# Patient Record
Sex: Male | Born: 1990 | Marital: Single | Smoking: Never smoker
Health system: Southern US, Community
[De-identification: ages and names within clinical notes are randomized; demographics above are authoritative.]

## PROBLEM LIST (undated history)

## (undated) DIAGNOSIS — I10 Essential (primary) hypertension: Secondary | ICD-10-CM

---

## 2010-11-05 ENCOUNTER — Other Ambulatory Visit: Payer: Self-pay | Admitting: Dermatology

## 2019-10-29 ENCOUNTER — Other Ambulatory Visit: Payer: Self-pay | Admitting: Nurse Practitioner

## 2019-10-29 DIAGNOSIS — M545 Low back pain, unspecified: Secondary | ICD-10-CM

## 2019-10-29 DIAGNOSIS — M5416 Radiculopathy, lumbar region: Secondary | ICD-10-CM

## 2019-11-13 ENCOUNTER — Other Ambulatory Visit: Payer: Self-pay

## 2019-11-13 ENCOUNTER — Ambulatory Visit
Admission: RE | Admit: 2019-11-13 | Discharge: 2019-11-13 | Disposition: A | Discharge status: Home / Self Care | Payer: Commercial Managed Care - PPO | Source: Ambulatory Visit | Attending: Nurse Practitioner | Admitting: Nurse Practitioner

## 2019-11-13 DIAGNOSIS — M545 Low back pain, unspecified: Secondary | ICD-10-CM

## 2019-11-13 DIAGNOSIS — M5416 Radiculopathy, lumbar region: Secondary | ICD-10-CM | POA: Diagnosis present

## 2020-03-19 ENCOUNTER — Emergency Department
Admission: EM | Admit: 2020-03-19 | Discharge: 2020-03-20 | Disposition: A | Discharge status: Home / Self Care | Payer: Commercial Managed Care - PPO | Attending: Emergency Medicine | Admitting: Emergency Medicine

## 2020-03-19 ENCOUNTER — Emergency Department: Payer: Commercial Managed Care - PPO

## 2020-03-19 ENCOUNTER — Other Ambulatory Visit: Payer: Self-pay

## 2020-03-19 ENCOUNTER — Encounter: Payer: Self-pay | Admitting: *Deleted

## 2020-03-19 DIAGNOSIS — R29818 Other symptoms and signs involving the nervous system: Secondary | ICD-10-CM

## 2020-03-19 DIAGNOSIS — G459 Transient cerebral ischemic attack, unspecified: Secondary | ICD-10-CM | POA: Insufficient documentation

## 2020-03-19 DIAGNOSIS — R4701 Aphasia: Secondary | ICD-10-CM | POA: Diagnosis present

## 2020-03-19 LAB — CBC
HCT: 38 % — ABNORMAL LOW (ref 39.0–52.0)
Hemoglobin: 13 g/dL (ref 13.0–17.0)
MCH: 30.3 pg (ref 26.0–34.0)
MCHC: 34.2 g/dL (ref 30.0–36.0)
MCV: 88.6 fL (ref 80.0–100.0)
Platelets: 433 10*3/uL — ABNORMAL HIGH (ref 150–400)
RBC: 4.29 MIL/uL (ref 4.22–5.81)
RDW: 11.5 % (ref 11.5–15.5)
WBC: 5.9 10*3/uL (ref 4.0–10.5)
nRBC: 0 % (ref 0.0–0.2)

## 2020-03-19 LAB — COMPREHENSIVE METABOLIC PANEL
ALT: 56 U/L — ABNORMAL HIGH (ref 0–44)
AST: 48 U/L — ABNORMAL HIGH (ref 15–41)
Albumin: 3.9 g/dL (ref 3.5–5.0)
Alkaline Phosphatase: 43 U/L (ref 38–126)
Anion gap: 10 (ref 5–15)
BUN: 11 mg/dL (ref 6–20)
CO2: 24 mmol/L (ref 22–32)
Calcium: 9.2 mg/dL (ref 8.9–10.3)
Chloride: 105 mmol/L (ref 98–111)
Creatinine, Ser: 0.89 mg/dL (ref 0.61–1.24)
GFR, Estimated: >60 mL/min (ref >60)
Glucose, Bld: 138 mg/dL — ABNORMAL HIGH (ref 70–99)
Potassium: 3.4 mmol/L — ABNORMAL LOW (ref 3.5–5.1)
Sodium: 139 mmol/L (ref 135–145)
Total Bilirubin: 0.7 mg/dL (ref 0.3–1.2)
Total Protein: 7.5 g/dL (ref 6.5–8.1)

## 2020-03-19 LAB — DIFFERENTIAL
Abs Immature Granulocytes: 0.05 10*3/uL (ref 0.00–0.07)
Basophils Absolute: 0 10*3/uL (ref 0.0–0.1)
Basophils Relative: 1 %
Eosinophils Absolute: 0 10*3/uL (ref 0.0–0.5)
Eosinophils Relative: 1 %
Immature Granulocytes: 1 %
Lymphocytes Relative: 45 %
Lymphs Abs: 2.7 10*3/uL (ref 0.7–4.0)
Monocytes Absolute: 0.6 10*3/uL (ref 0.1–1.0)
Monocytes Relative: 11 %
Neutro Abs: 2.4 10*3/uL (ref 1.7–7.7)
Neutrophils Relative %: 41 %

## 2020-03-19 LAB — PROTIME-INR
INR: 1 (ref 0.8–1.2)
Prothrombin Time: 12.6 seconds (ref 11.4–15.2)

## 2020-03-19 LAB — CBG MONITORING, ED: Glucose-Capillary: 130 mg/dL — ABNORMAL HIGH (ref 70–99)

## 2020-03-19 LAB — APTT: aPTT: 26 seconds (ref 24–36)

## 2020-03-19 MED ORDER — SODIUM CHLORIDE 0.9% FLUSH: 3.0000 mL | Freq: Once | INTRAVENOUS | Status: DC

## 2020-03-19 NOTE — ED Triage Notes (Addendum)
Pt to ED via EMS reporting changes in his speech for the past 2 days with intermittent headaches and cold like symptoms. Pt reporting "slurred speech" to EMS but upon assessment pt is not slurring words but stuttering. No other deficits noted. No trauma recently. No blood thinner use.   Pt tested negative for COVID this week.

## 2020-03-19 NOTE — ED Notes (Signed)
Pt transported to CT via Wheelchair

## 2020-03-19 NOTE — ED Provider Notes (Incomplete)
[facility] Emergency Department Provider Note  ____________________________________________   Event Date/Time   First MD Initiated Contact with Patient 03/19/20 2343     (approximate)  I have reviewed the triage vital signs and the nursing notes.   HISTORY  Chief Complaint Aphasia    HPI Jeffrey Zamora is a 30 y.o. male with no chronic medical issues who presents with concern for aphasia.  He said that he has felt poorly for the last few days with COVID-like symptoms including fatigue, malaise,  congestion, and intermittent headaches.  [facility] and they tested him for COVID-19 which was negative, and they recommended that he take Mucinex, ibuprofen, and Tylenol.  Earlier tonight he was by himself at home and did not feel well and when he talked he felt like he was slurring his speech.  He looked it up on the Internet and the Internet said that he may be having a stroke so he called 911.  When he was seen by EMS and in triage, it seems that he was occasionally stuttering rather than slurring words or having difficulty finding words or articulating them.  He said the symptoms were acute in onset and he feels that they were severe, nothing in particular made them better or worse but he feels much better now it feels like the symptoms have resolved.  He denies having any weakness or numbness in his extremities.  No balance issues.  No vision changes.  He denies drug or alcohol use.  No medications other than the 3 described above.  He denies fever, neck pain, neck stiffness, chest pain, shortness of breath, cough, nausea, vomiting, and abdominal pain.  He has never had symptoms like this in the past.  No recent history of trauma.  No history of blood clots.        History reviewed. No pertinent past medical history.  There are no problems to display for this patient.   History reviewed. No pertinent surgical history.  Prior to Admission  medications   Not on File    Allergies Patient has no known allergies.  History reviewed. No pertinent family history.  Social History Social History   Tobacco Use  . Smoking status: Never Smoker  . Smokeless tobacco: Never Used  Substance Use Topics  . Alcohol use: Not Currently  . Drug use: Never    Review of Systems Constitutional: No fever/chills Eyes: No visual changes. ENT: No sore throat. Cardiovascular: Denies chest pain. Respiratory: Denies shortness of breath. Gastrointestinal: No abdominal pain.  No nausea, no vomiting.  No diarrhea.  No constipation. Genitourinary: Negative for dysuria. Musculoskeletal: Negative for neck pain.  Negative for back pain. Integumentary: Negative for rash. Neurological: Transient episode of "slurred speech" versus stuttering.  Negative for headaches, focal weakness or numbness.   ____________________________________________   PHYSICAL EXAM:  VITAL SIGNS: ED Triage Vitals [03/19/20 1936]  Enc Vitals Group     BP (!) 159/96     Pulse Rate (!) 107     Resp 16     Temp 97.7 F (36.5 C)     Temp Source Oral     SpO2 98 %     Weight 113.4 kg (250 lb)     Height 1.829 m (6')     Head Circumference      Peak Flow      Pain Score 0     Pain Loc      Pain Edu?  Excl. in GC?     Constitutional: Alert and oriented.  Eyes: Conjunctivae are normal.  Pupils are equal and reactive.  Extraocular motion is intact, no nystagmus. Head: Atraumatic. Nose: No congestion/rhinnorhea. Mouth/Throat: Patient is wearing a mask. Neck: No stridor.  No meningeal signs.   Cardiovascular: Normal rate, regular rhythm. Good peripheral circulation. Respiratory: Normal respiratory effort.  No retractions. Gastrointestinal: Soft and nontender. No distention.  Musculoskeletal: No lower extremity tenderness nor edema. No gross deformities of extremities. Neurologic:  Normal speech and language. No gross focal neurologic deficits are appreciated;  he has no facial palsy or other cranial nerve deficits, good muscle strength throughout upper and lower extremities, no dysmetria on finger-nose testing, no abnormal gait.  His speech is clear with no stuttering, dysarthria, nor aphasia. Skin:  Skin is warm, dry and intact. Psychiatric: Mood and affect are a little bit anxious but generally appropriate under the circumstances.  ____________________________________________   LABS (all labs ordered are listed, but only abnormal results are displayed)  Labs Reviewed  CBC - Abnormal; Notable for the following components:      Result Value   HCT 38.0 (*)    Platelets 433 (*)    All other components within normal limits  COMPREHENSIVE METABOLIC PANEL - Abnormal; Notable for the following components:   Potassium 3.4 (*)    Glucose, Bld 138 (*)    AST 48 (*)    ALT 56 (*)    All other components within normal limits  CBG MONITORING, ED - Abnormal; Notable for the following components:   Glucose-Capillary 130 (*)    All other components within normal limits  PROTIME-INR  APTT  DIFFERENTIAL   ____________________________________________  EKG  ED ECG REPORT I, Jeffrey Zamora, the attending physician, personally viewed and interpreted this ECG.  Date: 03/19/2020 EKG Time: 20: 27 Rate: 111 Rhythm: Sinus tachycardia QRS Axis: normal Intervals: normal ST/T Wave abnormalities: normal Narrative Interpretation: no evidence of acute ischemia  ____________________________________________  RADIOLOGY I, Jeffrey Zamora, personally viewed and evaluated these images (plain radiographs) as part of my medical decision making, as well as reviewing the written report by the radiologist.  ED MD interpretation: No acute abnormalities identified on noncontrast head CT.  Official radiology report(s): CT HEAD WO CONTRAST  Result Date: 03/19/2020 CLINICAL DATA:  Neuro deficit, acute, stroke suspected. Changes in speech for 2 days. Intermittent  headache. EXAM: CT HEAD WITHOUT CONTRAST TECHNIQUE: Contiguous axial images were obtained from the base of the skull through the vertex without intravenous contrast. COMPARISON:  None. FINDINGS: Brain: Brain volume is normal for age. No intracranial hemorrhage, mass effect, or midline shift. No hydrocephalus. The basilar cisterns are patent. No evidence of territorial infarct or acute ischemia. No extra-axial or intracranial fluid collection. Vascular: No hyperdense vessel. Skull: No fracture or focal lesion. Sinuses/Orbits: Minimal mucosal thickening of left side of sphenoid sinus. Remaining paranasal sinuses are clear. Included orbits are unremarkable. Mastoid air cells are well aerated. Other: None. IMPRESSION: Negative noncontrast head CT. Electronically Signed   By: Narda Rutherford M.D.   On: 03/19/2020 20:01    ____________________________________________   PROCEDURES   Procedure(s) performed (including Critical Care):  Procedures   ____________________________________________   INITIAL IMPRESSION / MDM / ASSESSMENT AND PLAN / ED COURSE  As part of my medical decision making, I reviewed the following data within the electronic MEDICAL RECORD NUMBER History obtained from family, Nursing notes reviewed and incorporated, Labs reviewed , EKG interpreted , Old chart reviewed and Notes from  prior ED visits   Differential diagnosis includes, but is not limited to, anxiety, medication side effect or drug use, CVA/TIA, metabolic or electrolyte abnormality, infectious process such as COVID or even meningitis/encephalitis.  Patient is very well-appearing, calm and cooperative, and currently asymptomatic.  Vital signs are normal other than some mild hypertension.  I personally reviewed the patient's head CT and agree with the radiologist assessment that there is no evidence of any acute abnormality.  CBC with differential, coagulation studies, and comprehensive metabolic panel are all essentially  normal other than a very mild AST and ALT elevation which is of unclear clinical significance but are just barely above the upper limit of normal.  Patient is neurologically intact at this time           ____________________________________________  FINAL CLINICAL IMPRESSION(S) / ED DIAGNOSES  Final diagnoses:  None     MEDICATIONS GIVEN DURING THIS VISIT:  Medications - No data to display   ED Discharge Orders    None      *Please note:  Jeffrey Zamora was evaluated in Emergency Department on 03/19/2020 for the symptoms described in the history of present illness. He was evaluated in the context of the global COVID-19 pandemic, which necessitated consideration that the patient might be at risk for infection with the SARS-CoV-2 virus that causes COVID-19. Institutional protocols and algorithms that pertain to the evaluation of patients at risk for COVID-19 are in a state of rapid change based on information released by regulatory bodies including the CDC and federal and state organizations. These policies and algorithms were followed during the patient's care in the ED.  Some ED evaluations and interventions may be delayed as a result of limited staffing during and after the pandemic.*  Note:  This document was prepared using Dragon voice recognition software and may include unintentional dictation errors.

## 2020-03-19 NOTE — ED Provider Notes (Signed)
[facility] Emergency Department Provider Note  ____________________________________________   Event Date/Time   First MD Initiated Contact with Patient 03/19/20 2343     (approximate)  I have reviewed the triage vital signs and the nursing notes.   HISTORY  Chief Complaint Aphasia    HPI Jeffrey Zamora is a 30 y.o. male with no chronic medical issues who presents with concern for aphasia.  He said that he has felt poorly for the last few days with COVID-like symptoms including fatigue, malaise,  congestion, and intermittent headaches.  [facility] and they tested him for COVID-19 which was negative, and they recommended that he take Mucinex, ibuprofen, and Tylenol.  Earlier tonight he was by himself at home and did not feel well and when he talked he felt like he was slurring his speech.  He looked it up on the Internet and the Internet said that he may be having a stroke so he called 911.  When he was seen by EMS and in triage, it seems that he was occasionally stuttering rather than slurring words or having difficulty finding words or articulating them.  He said the symptoms were acute in onset and he feels that they were severe, nothing in particular made them better or worse but he feels much better now it feels like the symptoms have resolved.  He denies having any weakness or numbness in his extremities.  No balance issues.  No vision changes.  He denies drug or alcohol use.  No medications other than the 3 described above.  He denies fever, neck pain, neck stiffness, chest pain, shortness of breath, cough, nausea, vomiting, and abdominal pain.  He has never had symptoms like this in the past.  No recent history of trauma.  No history of blood clots.        History reviewed. No pertinent past medical history.  There are no problems to display for this patient.   History reviewed. No pertinent surgical history.  Prior to Admission  medications   Not on File    Allergies Patient has no known allergies.  History reviewed. No pertinent family history.  Social History Social History   Tobacco Use  . Smoking status: Never Smoker  . Smokeless tobacco: Never Used  Substance Use Topics  . Alcohol use: Not Currently  . Drug use: Never    Review of Systems Constitutional: No fever/chills Eyes: No visual changes. ENT: No sore throat. Cardiovascular: Denies chest pain. Respiratory: Denies shortness of breath. Gastrointestinal: No abdominal pain.  No nausea, no vomiting.  No diarrhea.  No constipation. Genitourinary: Negative for dysuria. Musculoskeletal: Negative for neck pain.  Negative for back pain. Integumentary: Negative for rash. Neurological: Transient episode of "slurred speech" versus stuttering.  Negative for headaches, focal weakness or numbness.   ____________________________________________   PHYSICAL EXAM:  VITAL SIGNS: ED Triage Vitals [03/19/20 1936]  Enc Vitals Group     BP (!) 159/96     Pulse Rate (!) 107     Resp 16     Temp 97.7 F (36.5 C)     Temp Source Oral     SpO2 98 %     Weight 113.4 kg (250 lb)     Height 1.829 m (6')     Head Circumference      Peak Flow      Pain Score 0     Pain Loc      Pain Edu?  Excl. in GC?     Constitutional: Alert and oriented.  Eyes: Conjunctivae are normal.  Pupils are equal and reactive.  Extraocular motion is intact, no nystagmus. Head: Atraumatic. Nose: No congestion/rhinnorhea. Mouth/Throat: Patient is wearing a mask. Neck: No stridor.  No meningeal signs.   Cardiovascular: Normal rate, regular rhythm. Good peripheral circulation. Respiratory: Normal respiratory effort.  No retractions. Gastrointestinal: Soft and nontender. No distention.  Musculoskeletal: No lower extremity tenderness nor edema. No gross deformities of extremities. Neurologic:  Normal speech and language. No gross focal neurologic deficits are appreciated;  he has no facial palsy or other cranial nerve deficits, good muscle strength throughout upper and lower extremities, no dysmetria on finger-nose testing, no abnormal gait.  His speech is clear with no stuttering, dysarthria, nor aphasia. Skin:  Skin is warm, dry and intact. Psychiatric: Mood and affect are a little bit anxious but generally appropriate under the circumstances.  ____________________________________________   LABS (all labs ordered are listed, but only abnormal results are displayed)  Labs Reviewed  CBC - Abnormal; Notable for the following components:      Result Value   HCT 38.0 (*)    Platelets 433 (*)    All other components within normal limits  COMPREHENSIVE METABOLIC PANEL - Abnormal; Notable for the following components:   Potassium 3.4 (*)    Glucose, Bld 138 (*)    AST 48 (*)    ALT 56 (*)    All other components within normal limits  CBG MONITORING, ED - Abnormal; Notable for the following components:   Glucose-Capillary 130 (*)    All other components within normal limits  PROTIME-INR  APTT  DIFFERENTIAL   ____________________________________________  EKG  ED ECG REPORT I, Loleta Rose, the attending physician, personally viewed and interpreted this ECG.  Date: 03/19/2020 EKG Time: 20: 27 Rate: 111 Rhythm: Sinus tachycardia QRS Axis: normal Intervals: normal ST/T Wave abnormalities: normal Narrative Interpretation: no evidence of acute ischemia  ____________________________________________  RADIOLOGY I, Loleta Rose, personally viewed and evaluated these images (plain radiographs) as part of my medical decision making, as well as reviewing the written report by the radiologist.  ED MD interpretation: No acute abnormalities identified on noncontrast head CT.  Official radiology report(s): CT HEAD WO CONTRAST  Result Date: 03/19/2020 CLINICAL DATA:  Neuro deficit, acute, stroke suspected. Changes in speech for 2 days. Intermittent  headache. EXAM: CT HEAD WITHOUT CONTRAST TECHNIQUE: Contiguous axial images were obtained from the base of the skull through the vertex without intravenous contrast. COMPARISON:  None. FINDINGS: Brain: Brain volume is normal for age. No intracranial hemorrhage, mass effect, or midline shift. No hydrocephalus. The basilar cisterns are patent. No evidence of territorial infarct or acute ischemia. No extra-axial or intracranial fluid collection. Vascular: No hyperdense vessel. Skull: No fracture or focal lesion. Sinuses/Orbits: Minimal mucosal thickening of left side of sphenoid sinus. Remaining paranasal sinuses are clear. Included orbits are unremarkable. Mastoid air cells are well aerated. Other: None. IMPRESSION: Negative noncontrast head CT. Electronically Signed   By: Narda Rutherford M.D.   On: 03/19/2020 20:01    ____________________________________________   PROCEDURES   Procedure(s) performed (including Critical Care):  Procedures   ____________________________________________   INITIAL IMPRESSION / MDM / ASSESSMENT AND PLAN / ED COURSE  As part of my medical decision making, I reviewed the following data within the electronic MEDICAL RECORD NUMBER History obtained from family, Nursing notes reviewed and incorporated, Labs reviewed , EKG interpreted , Old chart reviewed and Notes from  prior ED visits   Differential diagnosis includes, but is not limited to, anxiety, medication side effect or drug use, CVA/TIA, metabolic or electrolyte abnormality, infectious process such as COVID or even meningitis/encephalitis.  Patient is very well-appearing, calm and cooperative, and currently asymptomatic.  Vital signs are normal other than some mild hypertension.  I personally reviewed the patient's head CT and agree with the radiologist assessment that there is no evidence of any acute abnormality.  CBC with differential, coagulation studies, and comprehensive metabolic panel are all essentially  normal other than a very mild AST and ALT elevation which is of unclear clinical significance but are just barely above the upper limit of normal.  EKG was notable for tachycardia in triage which is likely due to anxiety but that tachycardia has resolved and there is no evidence of any other abnormality or ischemia nor arrhythmia.  Patient is neurologically intact at this time.  I provided reassurance and explained I think it is extremely unlikely that he is having a stroke.  I explained the reassuring results that we have thus far.  I explained that we could get an MR brain to further rule out CVA but that I do not feel it is necessary given his very low risk and reassuring findings thus far.  I explained that I think it is possible that he is not feeling well and generalized and started to have a little bit of anxiety or panic when his body feels different than normal and he started looking on the Internet and became afraid he was having a stroke.  His mother is at bedside and was nodding and seems to agree with this assessment.  The patient feels reassured as well and he declines the MRI at this time.  I am giving him follow-up information to help establish a primary care doctor and I gave my usual and customary return precautions.           ____________________________________________  FINAL CLINICAL IMPRESSION(S) / ED DIAGNOSES  Final diagnoses:  Transient neurologic deficit     MEDICATIONS GIVEN DURING THIS VISIT:  Medications - No data to display   ED Discharge Orders    None      *Please note:  ALEXANDERJAMES BERG was evaluated in Emergency Department on 03/20/2020 for the symptoms described in the history of present illness. He was evaluated in the context of the global COVID-19 pandemic, which necessitated consideration that the patient might be at risk for infection with the SARS-CoV-2 virus that causes COVID-19. Institutional protocols and algorithms that pertain to the evaluation  of patients at risk for COVID-19 are in a state of rapid change based on information released by regulatory bodies including the CDC and federal and state organizations. These policies and algorithms were followed during the patient's care in the ED.  Some ED evaluations and interventions may be delayed as a result of limited staffing during and after the pandemic.*  Note:  This document was prepared using Dragon voice recognition software and may include unintentional dictation errors.   Loleta Rose, MD 03/20/20 [phone]

## 2020-03-20 NOTE — Discharge Instructions (Addendum)
Your workup in the Emergency Department today was reassuring.  We did not find any specific abnormalities.  We recommend you drink plenty of fluids, take your regular medications and/or any new ones prescribed today, and follow up with the doctor(s) listed in these documents as recommended.  Return to the Emergency Department if you develop new or worsening symptoms that concern you.  

## 2020-05-19 ENCOUNTER — Encounter: Payer: Self-pay | Admitting: Adult Health

## 2020-05-19 ENCOUNTER — Ambulatory Visit (INDEPENDENT_AMBULATORY_CARE_PROVIDER_SITE_OTHER): Payer: Commercial Managed Care - PPO | Admitting: Adult Health

## 2020-05-19 ENCOUNTER — Other Ambulatory Visit: Payer: Self-pay

## 2020-05-19 VITALS — BP 132/83 | HR 93 | Temp 97.9°F | Resp 16 | Ht 72.0 in | Wt 259.8 lb

## 2020-05-19 DIAGNOSIS — Z6836 Body mass index (BMI) 36.0-36.9, adult: Secondary | ICD-10-CM | POA: Insufficient documentation

## 2020-05-19 DIAGNOSIS — Z113 Encounter for screening for infections with a predominantly sexual mode of transmission: Secondary | ICD-10-CM

## 2020-05-19 DIAGNOSIS — Z0000 Encounter for general adult medical examination without abnormal findings: Secondary | ICD-10-CM | POA: Insufficient documentation

## 2020-05-19 DIAGNOSIS — Z1389 Encounter for screening for other disorder: Secondary | ICD-10-CM

## 2020-05-19 DIAGNOSIS — E559 Vitamin D deficiency, unspecified: Secondary | ICD-10-CM

## 2020-05-19 DIAGNOSIS — Z1159 Encounter for screening for other viral diseases: Secondary | ICD-10-CM | POA: Diagnosis not present

## 2020-05-19 DIAGNOSIS — Z6835 Body mass index (BMI) 35.0-35.9, adult: Secondary | ICD-10-CM

## 2020-05-19 DIAGNOSIS — Z23 Encounter for immunization: Secondary | ICD-10-CM | POA: Insufficient documentation

## 2020-05-19 DIAGNOSIS — Z87898 Personal history of other specified conditions: Secondary | ICD-10-CM

## 2020-05-19 DIAGNOSIS — K644 Residual hemorrhoidal skin tags: Secondary | ICD-10-CM | POA: Diagnosis not present

## 2020-05-19 DIAGNOSIS — Z114 Encounter for screening for human immunodeficiency virus [HIV]: Secondary | ICD-10-CM | POA: Diagnosis not present

## 2020-05-19 LAB — POCT URINALYSIS DIPSTICK
Bilirubin, UA: NEGATIVE
Blood, UA: NEGATIVE
Glucose, UA: NEGATIVE
Ketones, UA: NEGATIVE
Leukocytes, UA: NEGATIVE
Nitrite, UA: NEGATIVE
Protein, UA: NEGATIVE
Spec Grav, UA: 1.01 (ref 1.010–1.025)
Urobilinogen, UA: 0.2 E.U./dL
pH, UA: 5 (ref 5.0–8.0)

## 2020-05-19 MED ORDER — HYDROCORTISONE (PERIANAL) 2.5 % EX CREA: 1.0000 "application " | TOPICAL_CREAM | Freq: Two times a day (BID) | CUTANEOUS | 0 refills | Status: DC

## 2020-05-19 NOTE — Progress Notes (Signed)
New patient visit   Patient: Jeffrey Zamora   DOB: 09-Mar-1990   30 y.o. Male  MRN: [id number] Visit Date: 05/19/2020  Today's healthcare provider: Marcille Buffy, FNP   Chief Complaint  Patient presents with  . New Patient (Initial Visit)   Subjective    Jeffrey Zamora is a 30 y.o. male who presents today as a new patient to establish care.  HPI  Patient presents in office today to establish care he states that he feels well today and has no concerns to address. Patient reports that he follows a general diet, he is staying active by walking daily and states that his sleep habits are fair.   He was tested covid symptoms and tested negative, stuttering speech at that time and called 911. CT of head was negative. He was having some diarrhea at that time.   He feels he has a hemorrhoid. present for few months. Denies any bleeding or pain. Denies any black or tarry stools.  Feels well now and has had no other symptoms or episodes.   Patient  denies any fever, body aches,chills, rash, chest pain, shortness of breath, nausea, vomiting, or diarrhea.  Denies dizziness, lightheadedness, pre syncopal or syncopal episodes.   History reviewed. No pertinent past medical history. History reviewed. No pertinent surgical history. No family status information on file.   History reviewed. No pertinent family history. Social History   Socioeconomic History  . Marital status: Single    Spouse name: Not on file  . Number of children: Not on file  . Years of education: Not on file  . Highest education level: Not on file  Occupational History  . Not on file  Tobacco Use  . Smoking status: Never Smoker  . Smokeless tobacco: Never Used  Substance and Sexual Activity  . Alcohol use: Not Currently  . Drug use: Never  . Sexual activity: Not Currently  Other Topics Concern  . Not on file  Social History Narrative  . Not on file   Social Determinants of Health   Financial  Resource Strain: Not on file  Food Insecurity: Not on file  Transportation Needs: Not on file  Physical Activity: Not on file  Stress: Not on file  Social Connections: Not on file   No outpatient medications prior to visit.   No facility-administered medications prior to visit.   No Known Allergies  There is no immunization history for the selected administration types on file for this patient.  Health Maintenance  Topic Date Due  . TETANUS/TDAP  Never done  . COVID-19 Vaccine (1) 06/04/2020 (Originally 12/18/1995)  . INFLUENZA VACCINE  06/05/2020 (Originally 10/07/2019)  . Hepatitis C Screening  Completed  . HIV Screening  Completed  . HPV VACCINES  Aged Out    Patient Care Team: Moiz Ryant, Kelby Aline, FNP as PCP - General (Family Medicine)  Review of Systems  Constitutional: Positive for fatigue.  HENT: Positive for rhinorrhea.   Musculoskeletal: Positive for back pain.  All other systems reviewed and are negative.      Objective    BP 132/83   Pulse 93   Temp 97.9 F (36.6 C) (Oral)   Resp 16   Ht 6' (1.829 m)   Wt 259 lb 12.8 oz (117.8 kg)   SpO2 100%   BMI 35.24 kg/m  Physical Exam Vitals and nursing note reviewed. Exam conducted with a chaperone present.  Constitutional:      General: He is not in  acute distress.    Appearance: Normal appearance. He is well-developed. He is obese. He is not ill-appearing, toxic-appearing or diaphoretic.     Comments: Patient is alert and oriented and responsive to questions Engages in eye contact with provider. Speaks in full sentences without any pauses without any shortness of breath or distress.    HENT:     Head: Normocephalic and atraumatic.     Right Ear: Hearing, tympanic membrane, ear canal and external ear normal.     Left Ear: Hearing, tympanic membrane, ear canal and external ear normal.     Nose: Nose normal.     Mouth/Throat:     Pharynx: Uvula midline. No oropharyngeal exudate.  Eyes:     General: Lids  are normal. No scleral icterus.       Right eye: No discharge.        Left eye: No discharge.     Conjunctiva/sclera: Conjunctivae normal.     Pupils: Pupils are equal, round, and reactive to light.  Neck:     Thyroid: No thyromegaly.     Vascular: Normal carotid pulses. No carotid bruit, hepatojugular reflux or JVD.     Trachea: Trachea and phonation normal. No tracheal tenderness or tracheal deviation.     Meningeal: Brudzinski's sign absent.  Cardiovascular:     Rate and Rhythm: Normal rate and regular rhythm.     Pulses: Normal pulses.     Heart sounds: Normal heart sounds, S1 normal and S2 normal. Heart sounds not distant. No murmur heard. No friction rub. No gallop.   Pulmonary:     Effort: Pulmonary effort is normal. No accessory muscle usage or respiratory distress.     Breath sounds: Normal breath sounds. No stridor. No wheezing or rales.  Chest:     Chest wall: No tenderness.  Abdominal:     General: Bowel sounds are normal. There is no distension.     Palpations: Abdomen is soft. There is no mass.     Tenderness: There is no abdominal tenderness. There is no guarding or rebound.     Hernia: No hernia is present.  Genitourinary:    Rectum: Guaiac result negative. External hemorrhoid and internal hemorrhoid present. No mass, tenderness or anal fissure. Normal anal tone.       Comments: Internal small hemorrhoid internal at 3 o 'clock. Musculoskeletal:        General: No tenderness or deformity. Normal range of motion.     Cervical back: Full passive range of motion without pain, normal range of motion and neck supple.     Comments: Patient moves on and off of exam table and in room without difficulty. Gait is normal in hall and in room. Patient is oriented to person place time and situation. Patient answers questions appropriately and engages in conversation.   Lymphadenopathy:     Head:     Right side of head: No submental, submandibular, tonsillar, preauricular,  posterior auricular or occipital adenopathy.     Left side of head: No submental, submandibular, tonsillar, preauricular, posterior auricular or occipital adenopathy.     Cervical: No cervical adenopathy.  Skin:    General: Skin is warm and dry.     Capillary Refill: Capillary refill takes less than 2 seconds.     Coloration: Skin is not pale.     Findings: No erythema or rash.     Nails: There is no clubbing.  Neurological:     Mental Status: He is alert and oriented   to person, place, and time.     GCS: GCS eye subscore is 4. GCS verbal subscore is 5. GCS motor subscore is 6.     Cranial Nerves: No cranial nerve deficit.     Sensory: No sensory deficit.     Motor: No abnormal muscle tone.     Coordination: Coordination normal.     Gait: Gait normal.     Deep Tendon Reflexes: Reflexes are normal and symmetric. Reflexes normal.  Psychiatric:        Speech: Speech normal.        Behavior: Behavior normal.        Thought Content: Thought content normal.        Judgment: Judgment normal.      Depression Screen PHQ 2/9 Scores 05/19/2020  PHQ - 2 Score 3  PHQ- 9 Score 16   Results for orders placed or performed in visit on 05/19/20  CBC with Differential/Platelet  Result Value Ref Range   WBC 6.5 3.4 - 10.8 x10E3/uL   RBC 4.88 4.14 - 5.80 x10E6/uL   Hemoglobin 15.3 13.0 - 17.7 g/dL   Hematocrit 43.7 37.5 - 51.0 %   MCV 90 79 - 97 fL   MCH 31.4 26.6 - 33.0 pg   MCHC 35.0 31.5 - 35.7 g/dL   RDW 12.4 11.6 - 15.4 %   Platelets 238 150 - 450 x10E3/uL   Neutrophils 57 Not Estab. %   Lymphs 33 Not Estab. %   Monocytes 7 Not Estab. %   Eos 2 Not Estab. %   Basos 1 Not Estab. %   Neutrophils Absolute 3.8 1.4 - 7.0 x10E3/uL   Lymphocytes Absolute 2.2 0.7 - 3.1 x10E3/uL   Monocytes Absolute 0.4 0.1 - 0.9 x10E3/uL   EOS (ABSOLUTE) 0.1 0.0 - 0.4 x10E3/uL   Basophils Absolute 0.0 0.0 - 0.2 x10E3/uL   Immature Granulocytes 0 Not Estab. %   Immature Grans (Abs) 0.0 0.0 - 0.1 x10E3/uL   Comprehensive metabolic panel  Result Value Ref Range   Glucose 87 65 - 99 mg/dL   BUN 11 6 - 20 mg/dL   Creatinine, Ser 0.91 0.76 - 1.27 mg/dL   eGFR 117 >59 mL/min/1.73   BUN/Creatinine Ratio 12 9 - 20   Sodium 137 134 - 144 mmol/L   Potassium 4.6 3.5 - 5.2 mmol/L   Chloride 98 96 - 106 mmol/L   CO2 20 20 - 29 mmol/L   Calcium 10.0 8.7 - 10.2 mg/dL   Total Protein 7.6 6.0 - 8.5 g/dL   Albumin 5.0 4.1 - 5.2 g/dL   Globulin, Total 2.6 1.5 - 4.5 g/dL   Albumin/Globulin Ratio 1.9 1.2 - 2.2   Bilirubin Total 0.5 0.0 - 1.2 mg/dL   Alkaline Phosphatase 63 44 - 121 IU/L   AST 22 0 - 40 IU/L   ALT 39 0 - 44 IU/L  Lipid panel  Result Value Ref Range   Cholesterol, Total 172 100 - 199 mg/dL   Triglycerides 225 (H) 0 - 149 mg/dL   HDL 33 (L) >39 mg/dL   VLDL Cholesterol Cal 39 5 - 40 mg/dL   LDL Chol Calc (NIH) 100 (H) 0 - 99 mg/dL   Chol/HDL Ratio 5.2 (H) 0.0 - 5.0 ratio  TSH  Result Value Ref Range   TSH 2.410 0.450 - 4.500 uIU/mL  B12  Result Value Ref Range   Vitamin B-12 377 232 - 1,245 pg/mL  VITAMIN D 25 Hydroxy (Vit-D Deficiency, Fractures)  Result   Value Ref Range   Vit D, 25-Hydroxy 13.7 (L) 30.0 - 100.0 ng/mL  Hepatitis C Antibody  Result Value Ref Range   Hep C Virus Ab <0.1 0.0 - 0.9 s/co ratio  HIV antibody (with reflex)  Result Value Ref Range   HIV Screen 4th Generation wRfx Non Reactive Non Reactive  RPR  Result Value Ref Range   RPR Ser Ql Non Reactive Non Reactive  POCT urinalysis dipstick  Result Value Ref Range   Color, UA yellow    Clarity, UA clear    Glucose, UA Negative Negative   Bilirubin, UA negative    Ketones, UA negative    Spec Grav, UA 1.010 1.010 - 1.025   Blood, UA negative    pH, UA 5.0 5.0 - 8.0   Protein, UA Negative Negative   Urobilinogen, UA 0.2 0.2 or 1.0 E.U./dL   Nitrite, UA negative    Leukocytes, UA Negative Negative   Appearance     Odor      Assessment & Plan     The primary encounter diagnosis was External  hemorrhoids. Diagnoses of Screening for blood or protein in urine, Screening for HIV (human immunodeficiency virus), Need for hepatitis C screening test, Need for Tdap vaccination, Screening for STD (sexually transmitted disease), Vitamin D deficiency, History of neurological signs and symptoms, BMI 35.0-35.9,adult, and Body mass index (BMI) of 36.0-36.9 in adult were also pertinent to this visit.  Meds ordered this encounter  Medications  . hydrocortisone (ANUSOL-HC) 2.5 % rectal cream    Sig: Place 1 application rectally 2 (two) times daily.    Dispense:  30 g    Refill:  0    Return if symptoms worsen or fail to improve, for at any time for any worsening symptoms, Go to Emergency room/ urgent care if worse.      Red Flags discussed. The patient was given clear instructions to go to ER or return to medical center if any red flags develop, symptoms do not improve, worsen or new problems develop. They verbalized understanding.   The entirety of the information documented in the History of Present Illness, Review of Systems and Physical Exam were personally obtained by me. Portions of this information were initially documented by the CMA and reviewed by me for thoroughness and accuracy.     Michelle Smith Flinchum, FNP  [facility] [phone] (phone) [phone] (fax)  [facility] 

## 2020-05-19 NOTE — Patient Instructions (Addendum)
Hydrocortisone suppositories What is this medicine? HYDROCORTISONE (hye droe KOR ti sone) is a corticosteroid. It is used to decrease swelling, itching, and pain that is caused by minor skin irritations or by hemorrhoids. This medicine may be used for other purposes; ask your health care provider or pharmacist if you have questions. COMMON BRAND NAME(S): Anucort-HC, Anumed-HC, Anusol HC, Encort, GRx HiCort, Hemmorex-HC, Hemorrhoidal-HC, Hemril, Proctocort, Proctosert HC, Proctosol-HC, Rectacort HC, Rectasol-HC What should I tell my health care provider before I take this medicine? They need to know if you have any of these conditions:  an unusual or allergic reaction to hydrocortisone, corticosteroids, other medicines, foods, dyes, or preservatives  pregnant or trying to get pregnant  breast-feeding How should I use this medicine? This medicine is for rectal use only. Do not take by mouth. Wash your hands before and after use. Take off the foil wrapping. Wet the tip of the suppository with cold tap water to make it easier to use. Lie on your side with your lower leg straightened out and your upper leg bent forward toward your stomach. Lift upper buttock to expose the rectal area. Apply gentle pressure to insert the suppository completely into the rectum, pointed end first. Hold buttocks together for a few seconds. Remain lying down for about 15 minutes to avoid having the suppository come out. Do not use more often than directed. Talk to your pediatrician regarding the use of this medicine in children. Special care may be needed. Overdosage: If you think you have taken too much of this medicine contact a poison control center or emergency room at once. NOTE: This medicine is only for you. Do not share this medicine with others. What if I miss a dose? If you miss a dose, use it as soon as you can. If it is almost time for your next dose, use only that dose. Do not use double or extra doses. What  may interact with this medicine? Interactions are not expected. Do not use any other rectal products on the affected area without telling your doctor or health care professional. This list may not describe all possible interactions. Give your health care provider a list of all the medicines, herbs, non-prescription drugs, or dietary supplements you use. Also tell them if you smoke, drink alcohol, or use illegal drugs. Some items may interact with your medicine. What should I watch for while using this medicine? Visit your doctor or health care professional for regular checks on your progress. Tell your doctor or health care professional if your symptoms do not improve after a few days of use. Do not use if there is blood in your stools. If you get any type of infection while using this medicine, you may need to stop using this medicine until our infections clears up. Ask your doctor or health care professional for advice. What side effects may I notice from receiving this medicine? Side effects that you should report to your doctor or health care professional as soon as possible:  bloody or black, tarry stools  painful, red, pus filled blisters in hair follicles  rectal pain, burning or bleeding after use of medicine Side effects that usually do not require medical attention (report to your doctor or health care professional if they continue or are bothersome):  changes in skin color  dry skin  itching or irritation This list may not describe all possible side effects. Call your doctor for medical advice about side effects. You may report side effects to FDA at  1-800-FDA-1088. Where should I keep my medicine? Keep out of the reach of children. Store at room temperature between 20 and 25 degrees C (68 and 77 degrees F). Protect from heat and freezing. Throw away any unused medicine after the expiration date. NOTE: This sheet is a summary. It may not cover all possible information. If you have  questions about this medicine, talk to your doctor, pharmacist, or health care provider.  2021 Elsevier/Gold Standard (2007-07-07 16:07:24)   Health Maintenance, Male Adopting a healthy lifestyle and getting preventive care are important in promoting health and wellness. Ask your health care provider about:  The right schedule for you to have regular tests and exams.  Things you can do on your own to prevent diseases and keep yourself healthy. What should I know about diet, weight, and exercise? Eat a healthy diet  Eat a diet that includes plenty of vegetables, fruits, low-fat dairy products, and lean protein.  Do not eat a lot of foods that are high in solid fats, added sugars, or sodium.   Maintain a healthy weight Body mass index (BMI) is a measurement that can be used to identify possible weight problems. It estimates body fat based on height and weight. Your health care provider can help determine your BMI and help you achieve or maintain a healthy weight. Get regular exercise Get regular exercise. This is one of the most important things you can do for your health. Most adults should:  Exercise for at least 150 minutes each week. The exercise should increase your heart rate and make you sweat (moderate-intensity exercise).  Do strengthening exercises at least twice a week. This is in addition to the moderate-intensity exercise.  Spend less time sitting. Even light physical activity can be beneficial. Watch cholesterol and blood lipids Have your blood tested for lipids and cholesterol at 30 years of age, then have this test every 5 years. You may need to have your cholesterol levels checked more often if:  Your lipid or cholesterol levels are high.  You are older than 30 years of age.  You are at high risk for heart disease. What should I know about cancer screening? Many types of cancers can be detected early and may often be prevented. Depending on your health history and  family history, you may need to have cancer screening at various ages. This may include screening for:  Colorectal cancer.  Prostate cancer.  Skin cancer.  Lung cancer. What should I know about heart disease, diabetes, and high blood pressure? Blood pressure and heart disease  High blood pressure causes heart disease and increases the risk of stroke. This is more likely to develop in people who have high blood pressure readings, are of African descent, or are overweight.  Talk with your health care provider about your target blood pressure readings.  Have your blood pressure checked: ? Every 3-5 years if you are 46-26 years of age. ? Every year if you are 25 years old or older.  If you are between the ages of 82 and 46 and are a current or former smoker, ask your health care provider if you should have a one-time screening for abdominal aortic aneurysm (AAA). Diabetes Have regular diabetes screenings. This checks your fasting blood sugar level. Have the screening done:  Once every three years after age 66 if you are at a normal weight and have a low risk for diabetes.  More often and at a younger age if you are overweight or have  a high risk for diabetes. What should I know about preventing infection? Hepatitis B If you have a higher risk for hepatitis B, you should be screened for this virus. Talk with your health care provider to find out if you are at risk for hepatitis B infection. Hepatitis C Blood testing is recommended for:  Everyone born from [birth year] through 1965.  Anyone with known risk factors for hepatitis C. Sexually transmitted infections (STIs)  You should be screened each year for STIs, including gonorrhea and chlamydia, if: ? You are sexually active and are younger than 30 years of age. ? You are older than 30 years of age and your health care provider tells you that you are at risk for this type of infection. ? Your sexual activity has changed since you were  last screened, and you are at increased risk for chlamydia or gonorrhea. Ask your health care provider if you are at risk.  Ask your health care provider about whether you are at high risk for HIV. Your health care provider may recommend a prescription medicine to help prevent HIV infection. If you choose to take medicine to prevent HIV, you should first get tested for HIV. You should then be tested every 3 months for as long as you are taking the medicine. Follow these instructions at home: Lifestyle  Do not use any products that contain nicotine or tobacco, such as cigarettes, e-cigarettes, and chewing tobacco. If you need help quitting, ask your health care provider.  Do not use street drugs.  Do not share needles.  Ask your health care provider for help if you need support or information about quitting drugs. Alcohol use  Do not drink alcohol if your health care provider tells you not to drink.  If you drink alcohol: ? Limit how much you have to 0-2 drinks a day. ? Be aware of how much alcohol is in your drink. In the [place] one drink equals one 12 oz bottle of beer (355 mL), one 5 oz glass of wine (148 mL), or one 1 oz glass of hard liquor (44 mL). General instructions  Schedule regular health, dental, and eye exams.  Stay current with your vaccines.  Tell your health care provider if: ? You often feel depressed. ? You have ever been abused or do not feel safe at home. Summary  Adopting a healthy lifestyle and getting preventive care are important in promoting health and wellness.  Follow your health care provider's instructions about healthy diet, exercising, and getting tested or screened for diseases.  Follow your health care provider's instructions on monitoring your cholesterol and blood pressure. This information is not intended to replace advice given to you by your health care provider. Make sure you discuss any questions you have with your health care  provider. Document Revised: 02/15/2018 Document Reviewed: 02/15/2018 Elsevier Patient Education  2021 Elsevier Inc. Testicular Self-Exam A self-exam of your testicles (testicular self-exam) is looking at and feeling your testicles for unusual lumps or swelling. Swelling, lumps, or pain can be caused by:  Injuries.  Irritation and swelling (inflammation).  Infection.  Extra fluids around the testicle (hydrocele).  Twisted testicles (testicular torsion).  Cancer of the testicle (testicular cancer). You may be at risk for this if you have: ? A testicle that has not descended (cryptorchidism). ? A history of cancer of the testicle. ? A family history of cancer of the testicle. General tips  It is easiest to do a self-exam after a warm bath or  shower. Testicles are harder to examine when you are cold.  A normal testicle is egg-shaped and feels firm. It is smooth, and it is not tender.  It is normal to feel a firm cord that feels like spaghetti at the back of your testicles. This is called the spermatic cord. How to do a self-exam of testicles 1. Stand and hold your penis away from your body. 2. Look at each testicle to check for changes in how it looks. Look for swelling or changes in size or shape. 3. Roll each testicle between your thumb and finger. Feel the whole testicle. Feel for: ? Lumps. ? Swelling. ? Discomfort. 4. Check for swelling or tender bumps in the groin area. Your groin is where your lower belly (abdomen) meets your upper thighs.   Contact a doctor if:  You find a bump or lump. This may be a small, hard bump that is the size of a pea.  You find swelling.  You find pain.  You find soreness.  You see or feel any other changes. Summary  A self-exam of your testicles is looking at and feeling your testicles for lumps or swelling.  You should check each of your testicles for lumps, swelling, or discomfort.  You should check for swelling or tender bumps in  the groin area. Your groin is where your lower belly (abdomen) meets your upper thighs. This information is not intended to replace advice given to you by your health care provider. Make sure you discuss any questions you have with your health care provider. Document Revised: 01/29/2019 Document Reviewed: 01/29/2019 Elsevier Patient Education  2021 Elsevier Inc. Tdap (Tetanus, Diphtheria, Pertussis) Vaccine: What You Need to Know 1. Why get vaccinated? Tdap vaccine can prevent tetanus, diphtheria, and pertussis. Diphtheria and pertussis spread from person to person. Tetanus enters the body through cuts or wounds.  TETANUS (T) causes painful stiffening of the muscles. Tetanus can lead to serious health problems, including being unable to open the mouth, having trouble swallowing and breathing, or death.  DIPHTHERIA (D) can lead to difficulty breathing, heart failure, paralysis, or death.  PERTUSSIS (aP), also known as "whooping cough," can cause uncontrollable, violent coughing that makes it hard to breathe, eat, or drink. Pertussis can be extremely serious especially in babies and young children, causing pneumonia, convulsions, brain damage, or death. In teens and adults, it can cause weight loss, loss of bladder control, passing out, and rib fractures from severe coughing. 2. Tdap vaccine Tdap is only for children 7 years and older, adolescents, and adults.  Adolescents should receive a single dose of Tdap, preferably at age 41 or 12 years. Pregnant people should get a dose of Tdap during every pregnancy, preferably during the early part of the third trimester, to help protect the newborn from pertussis. Infants are most at risk for severe, life-threatening complications from pertussis. Adults who have never received Tdap should get a dose of Tdap. Also, adults should receive a booster dose of either Tdap or Td (a different vaccine that protects against tetanus and diphtheria but not pertussis)  every 10 years, or after 5 years in the case of a severe or dirty wound or burn. Tdap may be given at the same time as other vaccines. 3. Talk with your health care provider Tell your vaccine provider if the person getting the vaccine:  Has had an allergic reaction after a previous dose of any vaccine that protects against tetanus, diphtheria, or pertussis, or has any severe, life-threatening  allergies  Has had a coma, decreased level of consciousness, or prolonged seizures within 7 days after a previous dose of any pertussis vaccine (DTP, DTaP, or Tdap)  Has seizures or another nervous system problem  Has ever had Guillain-Barr Syndrome (also called "GBS")  Has had severe pain or swelling after a previous dose of any vaccine that protects against tetanus or diphtheria In some cases, your health care provider may decide to postpone Tdap vaccination until a future visit. People with minor illnesses, such as a cold, may be vaccinated. People who are moderately or severely ill should usually wait until they recover before getting Tdap vaccine.  Your health care provider can give you more information. 4. Risks of a vaccine reaction  Pain, redness, or swelling where the shot was given, mild fever, headache, feeling tired, and nausea, vomiting, diarrhea, or stomachache sometimes happen after Tdap vaccination. People sometimes faint after medical procedures, including vaccination. Tell your provider if you feel dizzy or have vision changes or ringing in the ears.  As with any medicine, there is a very remote chance of a vaccine causing a severe allergic reaction, other serious injury, or death. 5. What if there is a serious problem? An allergic reaction could occur after the vaccinated person leaves the clinic. If you see signs of a severe allergic reaction (hives, swelling of the face and throat, difficulty breathing, a fast heartbeat, dizziness, or weakness), call 9-1-1 and get the person to the  nearest hospital. For other signs that concern you, call your health care provider.  Adverse reactions should be reported to the Vaccine Adverse Event Reporting System (VAERS). Your health care provider will usually file this report, or you can do it yourself. Visit the VAERS website at www.vaers.LAgents.no or call 929-175-7035. VAERS is only for reporting reactions, and VAERS staff members do not give medical advice. 6. The National Vaccine Injury Compensation Program The [employer] Vaccine Injury Compensation Program (VICP) is a federal program that was created to compensate people who may have been injured by certain vaccines. Claims regarding alleged injury or death due to vaccination have a time limit for filing, which may be as short as two years. Visit the VICP website at SpiritualWord.at or call 680-316-4909 to learn about the program and about filing a claim. 7. How can I learn more?  Ask your health care provider.  Call your local or state health department.  Visit the website of the Food and Drug Administration (FDA) for vaccine package inserts and additional information at FinderList.no.  Contact the Centers for Disease Control and Prevention (CDC): ? Call 873-315-1971 (1-800-CDC-INFO) or ? Visit CDC's website at PicCapture.uy. Vaccine Information Statement Tdap (Tetanus, Diphtheria, Pertussis) Vaccine (10/12/2019) This information is not intended to replace advice given to you by your health care provider. Make sure you discuss any questions you have with your health care provider. Document Revised: 11/07/2019 Document Reviewed: 11/07/2019 Elsevier Patient Education  2021 Elsevier Inc.   Calorie Counting for [employer] Calories are units of energy. Your body needs a certain number of calories from food to keep going throughout the day. When you eat or drink more calories than your body needs, your body stores the extra  calories mostly as fat. When you eat or drink fewer calories than your body needs, your body burns fat to get the energy it needs. Calorie counting means keeping track of how many calories you eat and drink each day. Calorie counting can be helpful if you need to lose  weight. If you eat fewer calories than your body needs, you should lose weight. Ask your health care provider what a healthy weight is for you. For calorie counting to work, you will need to eat the right number of calories each day to lose a healthy amount of weight per week. A dietitian can help you figure out how many calories you need in a day and will suggest ways to reach your calorie goal.  A healthy amount of weight to lose each week is usually 1-2 lb (0.5-0.9 kg). This usually means that your daily calorie intake should be reduced by 500-750 calories.  Eating 1,200-1,500 calories a day can help most women lose weight.  Eating 1,500-1,800 calories a day can help most men lose weight. What do I need to know about calorie counting? Work with your health care provider or dietitian to determine how many calories you should get each day. To meet your daily calorie goal, you will need to:  Find out how many calories are in each food that you would like to eat. Try to do this before you eat.  Decide how much of the food you plan to eat.  Keep a food log. Do this by writing down what you ate and how many calories it had. To successfully lose weight, it is important to balance calorie counting with a healthy lifestyle that includes regular activity. Where do I find calorie information? The number of calories in a food can be found on a Nutrition Facts label. If a food does not have a Nutrition Facts label, try to look up the calories online or ask your dietitian for help. Remember that calories are listed per serving. If you choose to have more than one serving of a food, you will have to multiply the calories per serving by the  number of servings you plan to eat. For example, the label on a package of bread might say that a serving size is 1 slice and that there are 90 calories in a serving. If you eat 1 slice, you will have eaten 90 calories. If you eat 2 slices, you will have eaten 180 calories.   How do I keep a food log? After each time that you eat, record the following in your food log as soon as possible:  What you ate. Be sure to include toppings, sauces, and other extras on the food.  How much you ate. This can be measured in cups, ounces, or number of items.  How many calories were in each food and drink.  The total number of calories in the food you ate. Keep your food log near you, such as in a pocket-sized notebook or on an app or website on your mobile phone. Some programs will calculate calories for you and show you how many calories you have left to meet your daily goal. What are some portion-control tips?  Know how many calories are in a serving. This will help you know how many servings you can have of a certain food.  Use a measuring cup to measure serving sizes. You could also try weighing out portions on a kitchen scale. With time, you will be able to estimate serving sizes for some foods.  Take time to put servings of different foods on your favorite plates or in your favorite bowls and cups so you know what a serving looks like.  Try not to eat straight from a food's packaging, such as from a bag or box. Eating  straight from the package makes it hard to see how much you are eating and can lead to overeating. Put the amount you would like to eat in a cup or on a plate to make sure you are eating the right portion.  Use smaller plates, glasses, and bowls for smaller portions and to prevent overeating.  Try not to multitask. For example, avoid watching TV or using your computer while eating. If it is time to eat, sit down at a table and enjoy your food. This will help you recognize when you are  full. It will also help you be more mindful of what and how much you are eating. What are tips for following this plan? Reading food labels  Check the calorie count compared with the serving size. The serving size may be smaller than what you are used to eating.  Check the source of the calories. Try to choose foods that are high in protein, fiber, and vitamins, and low in saturated fat, trans fat, and sodium. Shopping  Read nutrition labels while you shop. This will help you make healthy decisions about which foods to buy.  Pay attention to nutrition labels for low-fat or fat-free foods. These foods sometimes have the same number of calories or more calories than the full-fat versions. They also often have added sugar, starch, or salt to make up for flavor that was removed with the fat.  Make a grocery list of lower-calorie foods and stick to it. Cooking  Try to cook your favorite foods in a healthier way. For example, try baking instead of frying.  Use low-fat dairy products. Meal planning  Use more fruits and vegetables. One-half of your plate should be fruits and vegetables.  Include lean proteins, such as chicken, [place], and fish. Lifestyle Each week, aim to do one of the following:  150 minutes of moderate exercise, such as walking.  75 minutes of vigorous exercise, such as running. General information  Know how many calories are in the foods you eat most often. This will help you calculate calorie counts faster.  Find a way of tracking calories that works for you. Get creative. Try different apps or programs if writing down calories does not work for you. What foods should I eat?  Eat nutritious foods. It is better to have a nutritious, high-calorie food, such as an avocado, than a food with few nutrients, such as a bag of potato chips.  Use your calories on foods and drinks that will fill you up and will not leave you hungry soon after eating. ? Examples of foods that  fill you up are nuts and nut butters, vegetables, lean proteins, and high-fiber foods such as whole grains. High-fiber foods are foods with more than 5 g of fiber per serving.  Pay attention to calories in drinks. Low-calorie drinks include water and unsweetened drinks. The items listed above may not be a complete list of foods and beverages you can eat. Contact a dietitian for more information.   What foods should I limit? Limit foods or drinks that are not good sources of vitamins, minerals, or protein or that are high in unhealthy fats. These include:  Candy.  Other sweets.  Sodas, specialty coffee drinks, alcohol, and juice. The items listed above may not be a complete list of foods and beverages you should avoid. Contact a dietitian for more information. How do I count calories when eating out?  Pay attention to portions. Often, portions are much larger when eating  out. Try these tips to keep portions smaller: ? Consider sharing a meal instead of getting your own. ? If you get your own meal, eat only half of it. Before you start eating, ask for a container and put half of your meal into it. ? When available, consider ordering smaller portions from the menu instead of full portions.  Pay attention to your food and drink choices. Knowing the way food is cooked and what is included with the meal can help you eat fewer calories. ? If calories are listed on the menu, choose the lower-calorie options. ? Choose dishes that include vegetables, fruits, whole grains, low-fat dairy products, and lean proteins. ? Choose items that are boiled, broiled, grilled, or steamed. Avoid items that are buttered, battered, fried, or served with cream sauce. Items labeled as crispy are usually fried, unless stated otherwise. ? Choose water, low-fat milk, unsweetened iced tea, or other drinks without added sugar. If you want an alcoholic beverage, choose a lower-calorie option, such as a glass of wine or light  beer. ? Ask for dressings, sauces, and syrups on the side. These are usually high in calories, so you should limit the amount you eat. ? If you want a salad, choose a garden salad and ask for grilled meats. Avoid extra toppings such as bacon, cheese, or fried items. Ask for the dressing on the side, or ask for olive oil and vinegar or lemon to use as dressing.  Estimate how many servings of a food you are given. Knowing serving sizes will help you be aware of how much food you are eating at restaurants. Where to find more information  Centers for Disease Control and Prevention: FootballExhibition.com.br  [place] Department of Agriculture: WrestlingReporter.dk Summary  Calorie counting means keeping track of how many calories you eat and drink each day. If you eat fewer calories than your body needs, you should lose weight.  A healthy amount of weight to lose per week is usually 1-2 lb (0.5-0.9 kg). This usually means reducing your daily calorie intake by 500-750 calories.  The number of calories in a food can be found on a Nutrition Facts label. If a food does not have a Nutrition Facts label, try to look up the calories online or ask your dietitian for help.  Use smaller plates, glasses, and bowls for smaller portions and to prevent overeating.  Use your calories on foods and drinks that will fill you up and not leave you hungry shortly after a meal. This information is not intended to replace advice given to you by your health care provider. Make sure you discuss any questions you have with your health care provider. Document Revised: 04/05/2019 Document Reviewed: 04/05/2019 Elsevier Patient Education  2021 Elsevier Inc.   Fat and Cholesterol Restricted Eating Plan Getting too much fat and cholesterol in your diet may cause health problems. Choosing the right foods helps keep your fat and cholesterol at normal levels. This can keep you from getting certain diseases. Your doctor may recommend an eating plan that  includes:  Total fat: ______% or less of total calories a day.  Saturated fat: ______% or less of total calories a day.  Cholesterol: less than _________mg a day.  Fiber: ______g a day. What are tips for following this plan? Meal planning  At meals, divide your plate into four equal parts: ? Fill one-half of your plate with vegetables and green salads. ? Fill one-fourth of your plate with whole grains. ? Fill one-fourth of  your plate with low-fat (lean) protein foods.  Eat fish that is high in omega-3 fats at least two times a week. This includes mackerel, tuna, sardines, and salmon.  Eat foods that are high in fiber, such as whole grains, beans, apples, broccoli, carrots, peas, and barley. General tips  Work with your doctor to lose weight if you need to.  Avoid: ? Foods with added sugar. ? Fried foods. ? Foods with partially hydrogenated oils.  Limit alcohol intake to no more than 1 drink a day for nonpregnant women and 2 drinks a day for men. One drink equals 12 oz of beer, 5 oz of wine, or 1 oz of hard liquor.   Reading food labels  Check food labels for: ? Trans fats. ? Partially hydrogenated oils. ? Saturated fat (g) in each serving. ? Cholesterol (mg) in each serving. ? Fiber (g) in each serving.  Choose foods with healthy fats, such as: ? Monounsaturated fats. ? Polyunsaturated fats. ? Omega-3 fats.  Choose grain products that have whole grains. Look for the word "whole" as the first word in the ingredient list. Cooking  Cook foods using low-fat methods. These include baking, boiling, grilling, and broiling.  Eat more home-cooked foods. Eat at restaurants and buffets less often.  Avoid cooking using saturated fats, such as butter, cream, palm oil, palm kernel oil, and coconut oil. Recommended foods Fruits  All fresh, canned (in natural juice), or frozen fruits. Vegetables  Fresh or frozen vegetables (raw, steamed, roasted, or grilled). Green  salads. Grains  Whole grains, such as whole wheat or whole grain breads, crackers, cereals, and pasta. Unsweetened oatmeal, bulgur, barley, quinoa, or brown rice. Corn or whole wheat flour tortillas. Meats and other protein foods  Ground beef (85% or leaner), grass-fed beef, or beef trimmed of fat. Skinless chicken or [place]. Ground chicken or [place]. Pork trimmed of fat. All fish and seafood. Egg whites. Dried beans, peas, or lentils. Unsalted nuts or seeds. Unsalted canned beans. Nut butters without added sugar or oil. Dairy  Low-fat or nonfat dairy products, such as skim or 1% milk, 2% or reduced-fat cheeses, low-fat and fat-free ricotta or cottage cheese, or plain low-fat and nonfat yogurt. Fats and oils  Tub margarine without trans fats. Light or reduced-fat mayonnaise and salad dressings. Avocado. Olive, canola, sesame, or safflower oils. The items listed above may not be a complete list of foods and beverages you can eat. Contact a dietitian for more information.   Foods to avoid Fruits  Canned fruit in heavy syrup. Fruit in cream or butter sauce. Fried fruit. Vegetables  Vegetables cooked in cheese, cream, or butter sauce. Fried vegetables. Grains  White bread. White pasta. White rice. Cornbread. Bagels, pastries, and croissants. Crackers and snack foods that contain trans fat and hydrogenated oils. Meats and other protein foods  Fatty cuts of meat. Ribs, chicken wings, bacon, sausage, bologna, salami, chitterlings, fatback, hot dogs, bratwurst, and packaged lunch meats. Liver and organ meats. Whole eggs and egg yolks. Chicken and [place] with skin. Fried meat. Dairy  Whole or 2% milk, cream, half-and-half, and cream cheese. Whole milk cheeses. Whole-fat or sweetened yogurt. Full-fat cheeses. Nondairy creamers and whipped toppings. Processed cheese, cheese spreads, and cheese curds. Beverages  Alcohol. Sugar-sweetened drinks such as sodas, lemonade, and fruit drinks. Fats and  oils  Butter, stick margarine, lard, shortening, ghee, or bacon fat. Coconut, palm kernel, and palm oils. Sweets and desserts  Corn syrup, sugars, honey, and molasses. Candy. Jam and jelly. Syrup.  Sweetened cereals. Cookies, pies, cakes, donuts, muffins, and ice cream. The items listed above may not be a complete list of foods and beverages you should avoid. Contact a dietitian for more information. Summary  Choosing the right foods helps keep your fat and cholesterol at normal levels. This can keep you from getting certain diseases.  At meals, fill one-half of your plate with vegetables and green salads.  Eat high-fiber foods, like whole grains, beans, apples, carrots, peas, and barley.  Limit added sugar, saturated fats, alcohol, and fried foods. This information is not intended to replace advice given to you by your health care provider. Make sure you discuss any questions you have with your health care provider. Document Revised: 06/27/2019 Document Reviewed: 06/27/2019 Elsevier Patient Education  2021 [employer].

## 2020-05-20 LAB — CBC WITH DIFFERENTIAL/PLATELET
Basophils Absolute: 0 10*3/uL (ref 0.0–0.2)
Basos: 1 %
EOS (ABSOLUTE): 0.1 10*3/uL (ref 0.0–0.4)
Eos: 2 %
Hematocrit: 43.7 % (ref 37.5–51.0)
Hemoglobin: 15.3 g/dL (ref 13.0–17.7)
Immature Grans (Abs): 0 10*3/uL (ref 0.0–0.1)
Immature Granulocytes: 0 %
Lymphocytes Absolute: 2.2 10*3/uL (ref 0.7–3.1)
Lymphs: 33 %
MCH: 31.4 pg (ref 26.6–33.0)
MCHC: 35 g/dL (ref 31.5–35.7)
MCV: 90 fL (ref 79–97)
Monocytes Absolute: 0.4 10*3/uL (ref 0.1–0.9)
Monocytes: 7 %
Neutrophils Absolute: 3.8 10*3/uL (ref 1.4–7.0)
Neutrophils: 57 %
Platelets: 238 10*3/uL (ref 150–450)
RBC: 4.88 x10E6/uL (ref 4.14–5.80)
RDW: 12.4 % (ref 11.6–15.4)
WBC: 6.5 10*3/uL (ref 3.4–10.8)

## 2020-05-20 LAB — COMPREHENSIVE METABOLIC PANEL
ALT: 39 IU/L (ref 0–44)
AST: 22 IU/L (ref 0–40)
Albumin/Globulin Ratio: 1.9 (ref 1.2–2.2)
Albumin: 5 g/dL (ref 4.1–5.2)
Alkaline Phosphatase: 63 IU/L (ref 44–121)
BUN/Creatinine Ratio: 12 (ref 9–20)
BUN: 11 mg/dL (ref 6–20)
Bilirubin Total: 0.5 mg/dL (ref 0.0–1.2)
CO2: 20 mmol/L (ref 20–29)
Calcium: 10 mg/dL (ref 8.7–10.2)
Chloride: 98 mmol/L (ref 96–106)
Creatinine, Ser: 0.91 mg/dL (ref 0.76–1.27)
Globulin, Total: 2.6 g/dL (ref 1.5–4.5)
Glucose: 87 mg/dL (ref 65–99)
Potassium: 4.6 mmol/L (ref 3.5–5.2)
Sodium: 137 mmol/L (ref 134–144)
Total Protein: 7.6 g/dL (ref 6.0–8.5)
eGFR: 117 mL/min/{1.73_m2} (ref >59)

## 2020-05-20 LAB — LIPID PANEL
Chol/HDL Ratio: 5.2 ratio — ABNORMAL HIGH (ref 0.0–5.0)
Cholesterol, Total: 172 mg/dL (ref 100–199)
HDL: 33 mg/dL — ABNORMAL LOW (ref >39)
LDL Chol Calc (NIH): 100 mg/dL — ABNORMAL HIGH (ref 0–99)
Triglycerides: 225 mg/dL — ABNORMAL HIGH (ref 0–149)
VLDL Cholesterol Cal: 39 mg/dL (ref 5–40)

## 2020-05-20 LAB — RPR: RPR Ser Ql: NONREACTIVE

## 2020-05-20 LAB — TSH: TSH: 2.41 u[IU]/mL (ref 0.450–4.500)

## 2020-05-20 LAB — HEPATITIS C ANTIBODY: Hep C Virus Ab: <0.1 s/co ratio (ref 0.0–0.9)

## 2020-05-20 LAB — HIV ANTIBODY (ROUTINE TESTING W REFLEX): HIV Screen 4th Generation wRfx: NONREACTIVE

## 2020-05-20 LAB — VITAMIN D 25 HYDROXY (VIT D DEFICIENCY, FRACTURES): Vit D, 25-Hydroxy: 13.7 ng/mL — ABNORMAL LOW (ref 30.0–100.0)

## 2020-05-20 LAB — VITAMIN B12: Vitamin B-12: 377 pg/mL (ref 232–1245)

## 2020-05-22 ENCOUNTER — Other Ambulatory Visit: Payer: Self-pay | Admitting: Adult Health

## 2020-05-22 DIAGNOSIS — E559 Vitamin D deficiency, unspecified: Secondary | ICD-10-CM

## 2020-05-22 DIAGNOSIS — E538 Deficiency of other specified B group vitamins: Secondary | ICD-10-CM

## 2020-05-22 MED ORDER — VITAMIN D (ERGOCALCIFEROL) 1.25 MG (50000 UNIT) PO CAPS: 50000.0000 [IU] | ORAL_CAPSULE | ORAL | 1 refills | Status: DC

## 2020-05-22 NOTE — Progress Notes (Signed)
CBC, CMP, TSH for thyroid within normal limits.  Total cholesterol and LDL elevated.  Discuss lifestyle modification with patient e.g. increase exercise, fiber, fruits, vegetables, lean meat, and omega 3/fish intake and decrease saturated fat.  If patient following strict diet and exercise program already please schedule follow up appointment with primary care physician  B12 is low end normal he can start over the counter Vitamin B12 at 500 mcg  once daily sublingual.   Vitamin  D is low, this can contribute to poor sleep and fatigue, will send in prescription for Vitamin D at 50,000 units by mouth once every 7 days/(once weekly) for 12 weeks. Advise recheck lab Vitamin D in 1-2 weeks after completing vitamin d prescription. Lab iis walk in and is closed during lunch during regular office hours.   Hepatitis C antibody is negative. HIV is non reactive/ negative.  RPR for syphilis is negative.   Recheck B12 and vitamin d level in around 3 months.   Meds ordered this encounter Medications  Vitamin D, Ergocalciferol, (DRISDOL) 1.25 MG (50000 UNIT) CAPS capsule   Sig: Take 1 capsule (50,000 Units total) by mouth every 7 (seven) days. (taking one tablet per week) walk in lab in office 1-2 weeks after completing prescription.   Dispense:  12 capsule   Refill:  1

## 2020-05-22 NOTE — Progress Notes (Signed)
Meds ordered this encounter  Medications  . Vitamin D, Ergocalciferol, (DRISDOL) 1.25 MG (50000 UNIT) CAPS capsule    Sig: Take 1 capsule (50,000 Units total) by mouth every 7 (seven) days. (taking one tablet per week) walk in lab in office 1-2 weeks after completing prescription.    Dispense:  12 capsule    Refill:  1  labs ordered for around 3 month for recheck.   Low serum vitamin B12 - Plan: B12  Vitamin D deficiency - Plan: Vitamin D, Ergocalciferol, (DRISDOL) 1.25 MG (50000 UNIT) CAPS capsule, VITAMIN D 25 Hydroxy (Vit-D Deficiency, Fractures)

## 2020-11-28 ENCOUNTER — Ambulatory Visit: Payer: Commercial Managed Care - PPO | Admitting: Family Medicine

## 2020-12-19 ENCOUNTER — Emergency Department: Payer: Commercial Managed Care - PPO

## 2020-12-19 ENCOUNTER — Emergency Department
Admission: EM | Admit: 2020-12-19 | Discharge: 2020-12-19 | Disposition: A | Discharge status: Home / Self Care | Payer: Commercial Managed Care - PPO | Attending: Emergency Medicine | Admitting: Emergency Medicine

## 2020-12-19 ENCOUNTER — Other Ambulatory Visit: Payer: Self-pay

## 2020-12-19 DIAGNOSIS — R079 Chest pain, unspecified: Secondary | ICD-10-CM

## 2020-12-19 DIAGNOSIS — I10 Essential (primary) hypertension: Secondary | ICD-10-CM | POA: Diagnosis not present

## 2020-12-19 DIAGNOSIS — R0602 Shortness of breath: Secondary | ICD-10-CM | POA: Diagnosis not present

## 2020-12-19 DIAGNOSIS — R000 Tachycardia, unspecified: Secondary | ICD-10-CM | POA: Diagnosis not present

## 2020-12-19 DIAGNOSIS — R0789 Other chest pain: Secondary | ICD-10-CM | POA: Diagnosis not present

## 2020-12-19 DIAGNOSIS — L299 Pruritus, unspecified: Secondary | ICD-10-CM | POA: Diagnosis not present

## 2020-12-19 LAB — COMPREHENSIVE METABOLIC PANEL
ALT: 45 U/L — ABNORMAL HIGH (ref 0–44)
AST: 26 U/L (ref 15–41)
Albumin: 4.4 g/dL (ref 3.5–5.0)
Alkaline Phosphatase: 58 U/L (ref 38–126)
Anion gap: 9 (ref 5–15)
BUN: 13 mg/dL (ref 6–20)
CO2: 28 mmol/L (ref 22–32)
Calcium: 9.9 mg/dL (ref 8.9–10.3)
Chloride: 99 mmol/L (ref 98–111)
Creatinine, Ser: 0.93 mg/dL (ref 0.61–1.24)
GFR, Estimated: >60 mL/min (ref >60)
Glucose, Bld: 108 mg/dL — ABNORMAL HIGH (ref 70–99)
Potassium: 3.8 mmol/L (ref 3.5–5.1)
Sodium: 136 mmol/L (ref 135–145)
Total Bilirubin: 1 mg/dL (ref 0.3–1.2)
Total Protein: 8 g/dL (ref 6.5–8.1)

## 2020-12-19 LAB — CBC
HCT: 45.5 % (ref 39.0–52.0)
Hemoglobin: 16 g/dL (ref 13.0–17.0)
MCH: 31.3 pg (ref 26.0–34.0)
MCHC: 35.2 g/dL (ref 30.0–36.0)
MCV: 89 fL (ref 80.0–100.0)
Platelets: 240 10*3/uL (ref 150–400)
RBC: 5.11 MIL/uL (ref 4.22–5.81)
RDW: 12.2 % (ref 11.5–15.5)
WBC: 7.7 10*3/uL (ref 4.0–10.5)
nRBC: 0 % (ref 0.0–0.2)

## 2020-12-19 LAB — TROPONIN I (HIGH SENSITIVITY)
Troponin I (High Sensitivity): 4 ng/L (ref <18)
Troponin I (High Sensitivity): 3 ng/L (ref <18)

## 2020-12-19 MED ORDER — HYDROCORTISONE 1 % EX LOTN
1.0000 | TOPICAL_LOTION | Freq: Two times a day (BID) | CUTANEOUS | 0 refills | Status: DC
Start: 2020-12-19 — End: 2021-08-26

## 2020-12-19 NOTE — ED Provider Notes (Signed)
Emergency Medicine Provider Triage Evaluation Note  Jeffrey Zamora , a 30 y.o. male  was evaluated in triage.  Pt complains of chest pain and rash since yesterday.  Review of Systems  Positive: Chest pain, rash Negative: Shortness of breath  Physical Exam  BP (!) 160/94   Pulse (!) 104   Temp 98 F (36.7 C) (Oral)   Resp 18   Ht 6' (1.829 m)   Wt 117.9 kg   SpO2 97%   BMI 35.26 kg/m  Gen:   Awake, no distress   Resp:  Normal effort  MSK:   Moves extremities without difficulty  Other:    Medical Decision Making  Medically screening exam initiated at 1:41 PM.  Appropriate orders placed.  Jeffrey Zamora was informed that the remainder of the evaluation will be completed by another provider, this initial triage assessment does not replace that evaluation, and the importance of remaining in the ED until their evaluation is complete.    Jeffrey Pester, FNP 12/19/20 1555    Jene Every, MD 12/22/20 [phone]

## 2020-12-19 NOTE — ED Provider Notes (Signed)
[facility]  ____________________________________________   Event Date/Time   First MD Initiated Contact with Patient 12/19/20 1541     (approximate)  I have reviewed the triage vital signs and the nursing notes.   HISTORY  Chief Complaint Chest Pain    HPI Jeffrey Zamora is a 30 y.o. male past medical history of obesity who presents with chest pain.  Symptoms started yesterday while he was driving.  He endorsed a pressure-like sensation in the center of his chest that did not radiate.  It is associate with some shortness of breath then resolved 15 minutes.  He did not have any pain the rest of the day.  This morning he had some short-lived chest pressure which is now resolved.  He is currently asymptomatic.  He denies associated fevers, chills nausea or diaphoresis.The patient denies hx of prior DVT/PE, unilateral leg pain/swelling, hormone use, recent surgery, hx of cancer, prolonged immobilization, or hemoptysis.  Patient does endorse a pruritic rash on his lower extremities, no clear exacerbating factor.  Patient is tearful and notes that he has been under significant stress lately due to issues with his girlfriend and his mom being sick.         No past medical history on file.  Patient Active Problem List   Diagnosis Date Noted   Low serum vitamin B12 05/22/2020   Body mass index (BMI) of 36.0-36.9 in adult 05/19/2020   History of neurological signs and symptoms 05/19/2020   Vitamin D deficiency 05/19/2020   Routine health maintenance 05/19/2020   Need for Tdap vaccination 05/19/2020   Need for hepatitis C screening test 05/19/2020   External hemorrhoids 05/19/2020    No past surgical history on file.  Prior to Admission medications   Medication Sig Start Date End Date Taking? Authorizing Provider  hydrocortisone 1 % lotion Apply 1 application topically 2 (two) times daily. 12/19/20  Yes Georga Hacking, MD  hydrocortisone (ANUSOL-HC) 2.5  % rectal cream Place 1 application rectally 2 (two) times daily. 05/19/20   Flinchum, Eula Fried, FNP  Vitamin D, Ergocalciferol, (DRISDOL) 1.25 MG (50000 UNIT) CAPS capsule Take 1 capsule (50,000 Units total) by mouth every 7 (seven) days. (taking one tablet per week) walk in lab in office 1-2 weeks after completing prescription. 05/22/20   Flinchum, Eula Fried, FNP    Allergies Patient has no known allergies.  No family history on file.  Social History Social History   Tobacco Use   Smoking status: Never   Smokeless tobacco: Never  Substance Use Topics   Alcohol use: Not Currently   Drug use: Never    Review of Systems   Review of Systems  Constitutional:  Negative for chills and fever.  Respiratory:  Positive for shortness of breath.   Cardiovascular:  Positive for chest pain.  Gastrointestinal:  Negative for abdominal pain, nausea and vomiting.  All other systems reviewed and are negative.  Physical Exam Updated Vital Signs BP (!) 160/94   Pulse (!) 104   Temp 98 F (36.7 C) (Oral)   Resp 18   Ht 6' (1.829 m)   Wt 117.9 kg   SpO2 97%   BMI 35.26 kg/m   Physical Exam Vitals and nursing note reviewed.  Constitutional:      General: He is not in acute distress.    Appearance: Normal appearance.  HENT:     Head: Normocephalic and atraumatic.  Eyes:     General: No scleral icterus.  Conjunctiva/sclera: Conjunctivae normal.  Pulmonary:     Effort: Pulmonary effort is normal. No respiratory distress.     Breath sounds: Normal breath sounds. No wheezing.  Musculoskeletal:        General: No deformity or signs of injury.     Cervical back: Normal range of motion.  Skin:    Coloration: Skin is not jaundiced or pale.  Neurological:     General: No focal deficit present.     Mental Status: He is alert and oriented to person, place, and time. Mental status is at baseline.  Psychiatric:     Comments: Patient is tearful, flat affect, denies suicidal ideation      LABS (all labs ordered are listed, but only abnormal results are displayed)  Labs Reviewed  COMPREHENSIVE METABOLIC PANEL - Abnormal; Notable for the following components:      Result Value   Glucose, Bld 108 (*)    ALT 45 (*)    All other components within normal limits  CBC  TROPONIN I (HIGH SENSITIVITY)  TROPONIN I (HIGH SENSITIVITY)   ____________________________________________  EKG  NSR, nml axis, nml intervals, no acute ischemic changes  ____________________________________________  RADIOLOGY Ky Barban, personally viewed and evaluated these images (plain radiographs) as part of my medical decision making, as well as reviewing the written report by the radiologist.  ED MD interpretation:  I reviewed the CXR which does not show any acute cardiopulmonary process      ____________________________________________   PROCEDURES  Procedure(s) performed (including Critical Care):  Procedures   ____________________________________________   INITIAL IMPRESSION / ASSESSMENT AND PLAN / ED COURSE     30 year old male who presents with chest pain that started yesterday.  Episode occurred while driving, was associated with some dyspnea lasted 15 minutes without other associated symptoms.  Did not recur for the rest of the day.  Has had several short-lived episodes today but is currently pain-free.  Vital signs notable for mild hypertension and tachycardia, normal O2 sat.  His chest x-ray does not show any infiltrate, EKG is nonischemic.  Labs from triage are normal, negative troponin.  Patient has no risk factors for ACS and story is very atypical.  Consider pulmonary embolism given his tachycardia however patient is very anxious and tearful my evaluation I suspect that this is more likely the reason for his tachycardia.  Additionally he has no risk factors for PE.  Would also expect his chest pain and dyspnea to be constant.  Patient notably does have a  pruritic rash on his lower extremities seems to have scabbed over now.  Suspect contact dermatitis.  Will write for topical hydrocortisone.      ____________________________________________   FINAL CLINICAL IMPRESSION(S) / ED DIAGNOSES  Final diagnoses:  Chest pain, unspecified type     ED Discharge Orders          Ordered    hydrocortisone 1 % lotion  2 times daily        12/19/20 1552             Note:  This document was prepared using Dragon voice recognition software and may include unintentional dictation errors.    Georga Hacking, MD 12/19/20 [phone]

## 2020-12-19 NOTE — Discharge Instructions (Signed)
Your EKG, blood work and chest x-ray are all reassuring today.  You can start using hydrocortisone cream for the rash.  If you develop worsening shortness of breath chest pain that is not resolving or new symptoms that are concerning to you, please return to the emergency department.

## 2020-12-19 NOTE — ED Triage Notes (Signed)
Pt here with chest pressure that started yesterday. Pt states pressure is centered and he feels like something is sitting on his chest. Pt also has rashes all over his body. Pt denies cardiac history in his family. Pt in NAD in triage.

## 2020-12-19 NOTE — ED Notes (Signed)
Patient stable and discharged with all personal belongings and AVS. AVS and discharge instructions reviewed with patient and opportunity for questions provided.   

## 2020-12-25 ENCOUNTER — Ambulatory Visit: Payer: Commercial Managed Care - PPO | Admitting: Family Medicine

## 2021-08-25 NOTE — Progress Notes (Unsigned)
Established patient visit   Patient: Jeffrey Zamora   DOB: 08/11/1990   31 y.o. Male  MRN: [id number] Visit Date: 08/26/2021  Today's healthcare provider: Jacky Kindle, FNP  Patient presents for new patient visit to establish care.  Introduced to Publishing rights manager role and practice setting.  All questions answered.  Discussed provider/patient relationship and expectations.   I,Tiffany J Bragg,acting as a scribe for Jacky Kindle, FNP.,have documented all relevant documentation on the behalf of Jacky Kindle, FNP,as directed by  Jacky Kindle, FNP while in the presence of Jacky Kindle, FNP.   Chief Complaint  Patient presents with   Ear Pain   Subjective    HPI HPI   Patient complains of R ear pain, drainage and congestion for 3 days.  Last edited by Marlana Salvage, CMA on 08/26/2021  3:12 PM.       Medications: Outpatient Medications Prior to Visit  Medication Sig   [DISCONTINUED] hydrocortisone (ANUSOL-HC) 2.5 % rectal cream Place 1 application rectally 2 (two) times daily.   [DISCONTINUED] hydrocortisone 1 % lotion Apply 1 application topically 2 (two) times daily.   [DISCONTINUED] Vitamin D, Ergocalciferol, (DRISDOL) 1.25 MG (50000 UNIT) CAPS capsule Take 1 capsule (50,000 Units total) by mouth every 7 (seven) days. (taking one tablet per week) walk in lab in office 1-2 weeks after completing prescription.   No facility-administered medications prior to visit.    Review of Systems     Objective    BP (!) 154/94 (BP Location: Right Arm, Patient Position: Sitting, Cuff Size: Normal)   Pulse 97   Temp 98.7 F (37.1 C) (Oral)   Resp 16   Ht 6' (1.829 m)   Wt 269 lb 12.8 oz (122.4 kg)   SpO2 98%   BMI 36.59 kg/m    Physical Exam Vitals and nursing note reviewed.  Constitutional:      Appearance: Normal appearance. He is obese.  HENT:     Head: Normocephalic and atraumatic.     Right Ear: Tympanic membrane, ear canal and external ear normal. There  is no impacted cerumen.     Left Ear: Tympanic membrane, ear canal and external ear normal. There is no impacted cerumen.     Nose: Nose normal.     Mouth/Throat:     Mouth: Mucous membranes are moist.     Pharynx: Oropharynx is clear.  Eyes:     Pupils: Pupils are equal, round, and reactive to light.  Cardiovascular:     Rate and Rhythm: Normal rate and regular rhythm.     Pulses: Normal pulses.     Heart sounds: Normal heart sounds.  Pulmonary:     Effort: Pulmonary effort is normal.     Breath sounds: Normal breath sounds.  Musculoskeletal:        General: Normal range of motion.     Cervical back: Normal range of motion.  Skin:    General: Skin is warm and dry.     Capillary Refill: Capillary refill takes less than 2 seconds.  Neurological:     General: No focal deficit present.     Mental Status: He is alert and oriented to person, place, and time. Mental status is at baseline.  Psychiatric:        Attention and Perception: Attention normal.        Mood and Affect: Mood is anxious. Affect is flat.        Speech: Speech  normal.        Behavior: Behavior normal.        Cognition and Memory: Cognition normal.      No results found for any visits on 08/26/21.  Assessment & Plan     Problem List Items Addressed This Visit       Cardiovascular and Mediastinum   Primary hypertension - Primary    New diagnosis; chronic and untreated for multiple years prior Start Hyzaar 50-12.5 QD, 2 month follow up recommended Associated with obesity      Relevant Medications   losartan-hydrochlorothiazide (HYZAAR) 50-12.5 MG tablet     Nervous and Auditory   Excessive cerumen in ear canal, right    Acute stable, R side lying for sleep Has been using OTC debrox to assist with excessive wax No signs of infection Denies other symptoms at this time Request for irrigation; provided by CMA        Other   Morbid obesity (HCC)    Chronic, stable Body mass index is 36.59  kg/m. Associated with HTN, new diagnosis- starting treatment today Discussed importance of healthy weight management Discussed diet and exercise Due for CPE- will do routine blood work in 2 months         Return in about 2 months (around 10/26/2021) for annual examination.      Leilani Merl, FNP, have reviewed all documentation for this visit. The documentation on 08/26/21 for the exam, diagnosis, procedures, and orders are all accurate and complete.    Jacky Kindle, FNP  [facility] [phone] (phone) [phone] (fax)  [facility]

## 2021-08-26 ENCOUNTER — Ambulatory Visit: Payer: Commercial Managed Care - PPO | Admitting: Family Medicine

## 2021-08-26 ENCOUNTER — Encounter: Payer: Self-pay | Admitting: Family Medicine

## 2021-08-26 ENCOUNTER — Ambulatory Visit: Payer: Self-pay | Admitting: *Deleted

## 2021-08-26 VITALS — BP 154/94 | HR 97 | Temp 98.7°F | Resp 16 | Ht 72.0 in | Wt 269.8 lb

## 2021-08-26 DIAGNOSIS — H6121 Impacted cerumen, right ear: Secondary | ICD-10-CM

## 2021-08-26 DIAGNOSIS — I10 Essential (primary) hypertension: Secondary | ICD-10-CM | POA: Insufficient documentation

## 2021-08-26 MED ORDER — LOSARTAN POTASSIUM-HCTZ 50-12.5 MG PO TABS: 1.0000 | ORAL_TABLET | Freq: Every day | ORAL | 1 refills | Status: DC

## 2021-08-26 NOTE — Assessment & Plan Note (Signed)
Chronic, stable Body mass index is 36.59 kg/m. Associated with HTN, new diagnosis- starting treatment today Discussed importance of healthy weight management Discussed diet and exercise Due for CPE- will do routine blood work in 2 months

## 2021-08-26 NOTE — Assessment & Plan Note (Signed)
New diagnosis; chronic and untreated for multiple years prior Start Hyzaar 50-12.5 QD, 2 month follow up recommended Associated with obesity

## 2021-08-26 NOTE — Telephone Encounter (Signed)
Summary: right ear was leaking fluid.   Pt stated last night that his right ear was leaking fluid. Pt stated this started about 3-4 days ago.   Medication is a chewable tablet pt is unsure of the name. Pt stated pain comes and goes. Once he takes medication, the pain goes away, but when it wears off, the pain comes back. Pt also mentioned he difficulty sleeping due to pain.   Pt seeking clinical advice.   Pt has an appointment today 08/26/2021 3:00 PM      Reason for Disposition  White, yellow, or green discharge  Answer Assessment - Initial Assessment Questions 1. LOCATION: "Which ear is involved?"     Right ear pain 2. ONSET: "When did the ear start hurting"      3-4 days ago felt pressure 3. SEVERITY: "How bad is the pain?"  (Scale 1-10; mild, moderate or severe)   - MILD (1-3): doesn't interfere with normal activities    - MODERATE (4-7): interferes with normal activities or awakens from sleep    - SEVERE (8-10): excruciating pain, unable to do any normal activities      Mild/moderate 4. URI SYMPTOMS: "Do you have a runny nose or cough?"     no 5. FEVER: "Do you have a fever?" If Yes, ask: "What is your temperature, how was it measured, and when did it start?"     no 6. CAUSE: "Have you been swimming recently?", "How often do you use Q-TIPS?", "Have you had any recent air travel or scuba diving?"     Er drops and Q-tips 7. OTHER SYMPTOMS: "Do you have any other symptoms?" (e.g., headache, stiff neck, dizziness, vomiting, runny nose, decreased hearing)     Ear discharge 8. PREGNANCY: "Is there any chance you are pregnant?" "When was your last menstrual period?"  Protocols used: Davina Poke

## 2021-08-26 NOTE — Telephone Encounter (Signed)
  Chief Complaint: R ear pain Symptoms: mucus discharge Frequency: 3-4 days Pertinent Negatives: Patient denies fever, cold/sinus symptoms Disposition: [] ED /[] Urgent Care (no appt availability in office) / [x] Appointment(In office/virtual)/ []  [facility]/ [] Home Care/ [] Refused Recommended Disposition /[] [facility]/ []  Follow-up with PCP Additional Notes:  Encouraged to keep appointment scheduled for this afternoon

## 2021-08-26 NOTE — Assessment & Plan Note (Signed)
Acute stable, R side lying for sleep Has been using OTC debrox to assist with excessive wax No signs of infection Denies other symptoms at this time Request for irrigation; provided by CMA

## 2021-10-12 IMAGING — CT CT HEAD W/O CM
3 series · 15 of 47 positions shown, 18 images · non-contrast
Comparison: None.

CLINICAL DATA: Neuro deficit, acute, stroke suspected. Changes in
speech for 2 days. Intermittent headache.

EXAM:
CT HEAD WITHOUT CONTRAST
TECHNIQUE: Contiguous axial images were obtained from the base of the skull
through the vertex without intravenous contrast.

[Series 3: coronal soft tissue · coronal · 0.33mm/px · 3 of 64 slices shown]
[im 22/64  brain]
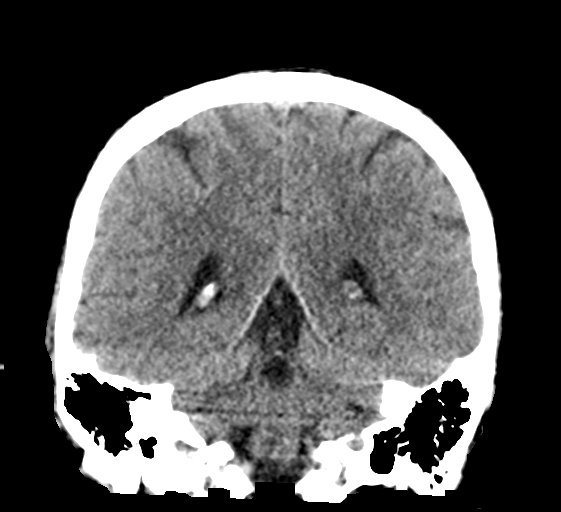
[im 29/64  brain]
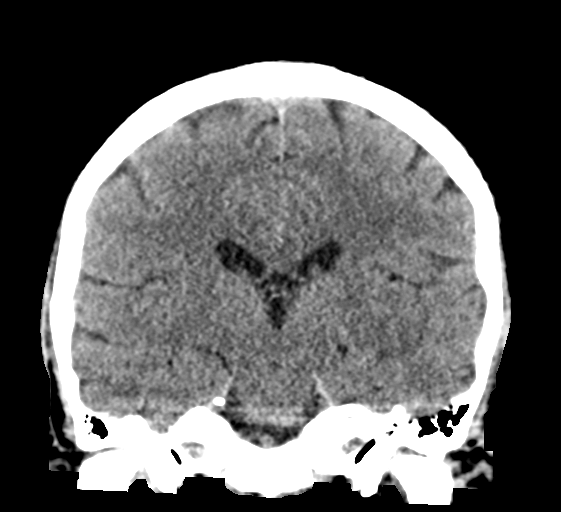
[im 36/64  brain]
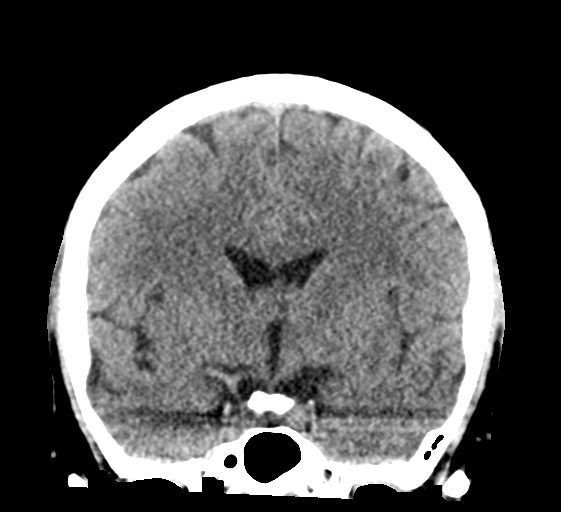

[Series 4: head wo · axial · 0.45mm/px · z∈[-139,-14]mm · 9 of 31 slices shown, 12 images]
[im 3/31  brain]
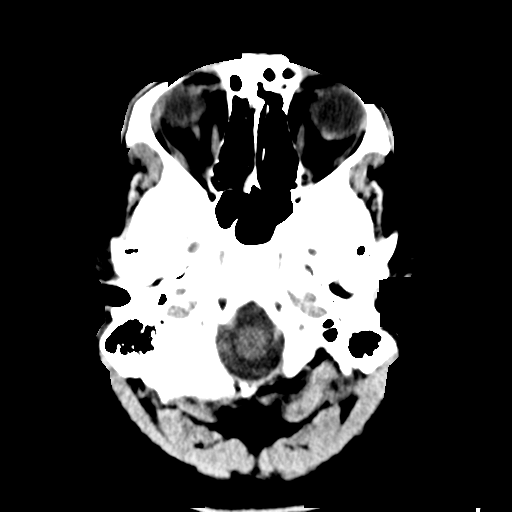
[im 3/31  bone]
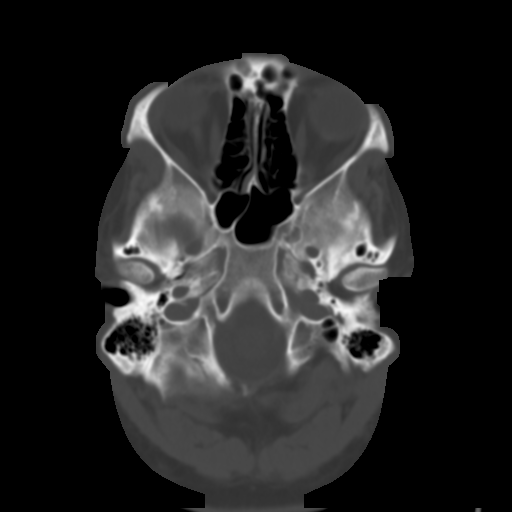
[im 6/31  brain]
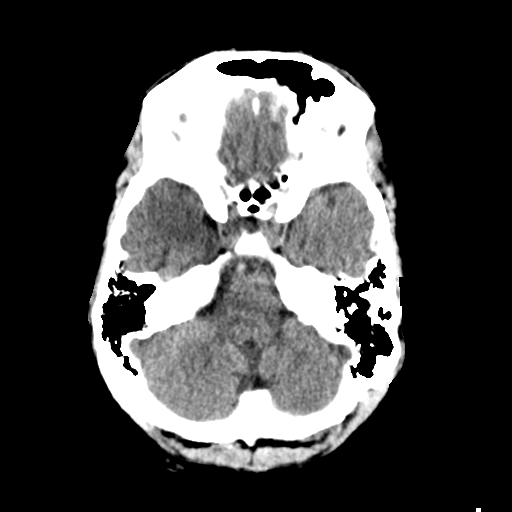
[im 9/31  brain]
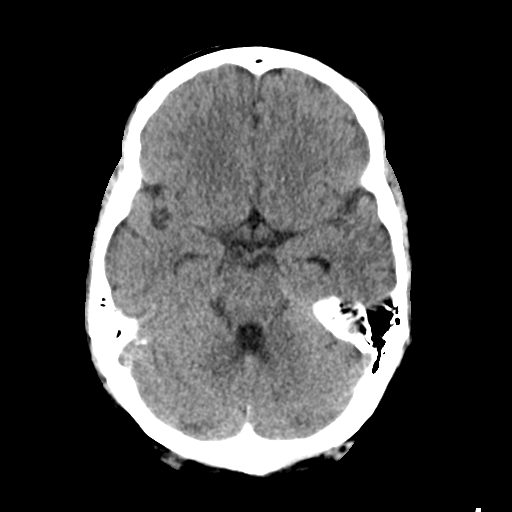
[im 12/31  brain]
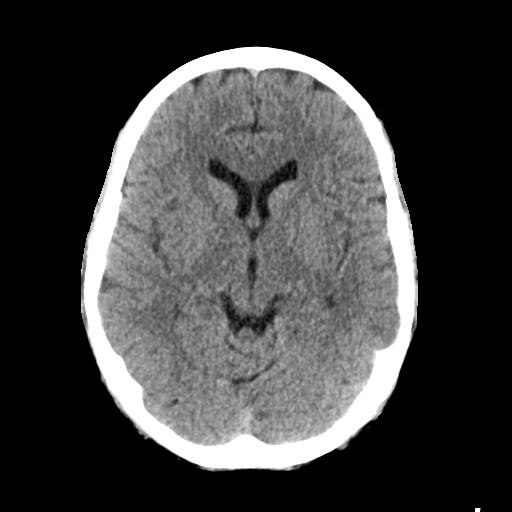
[im 16/31  brain]
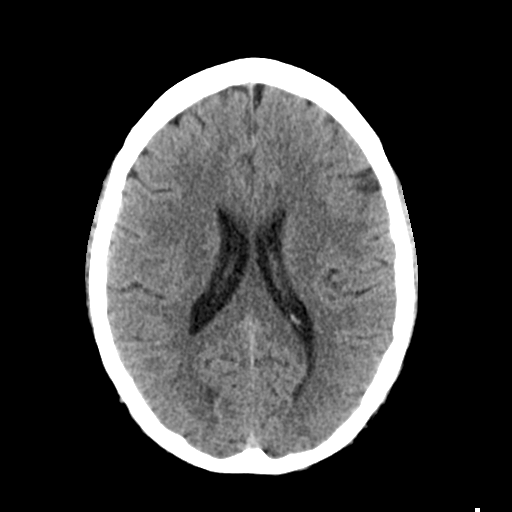
[im 16/31  bone]
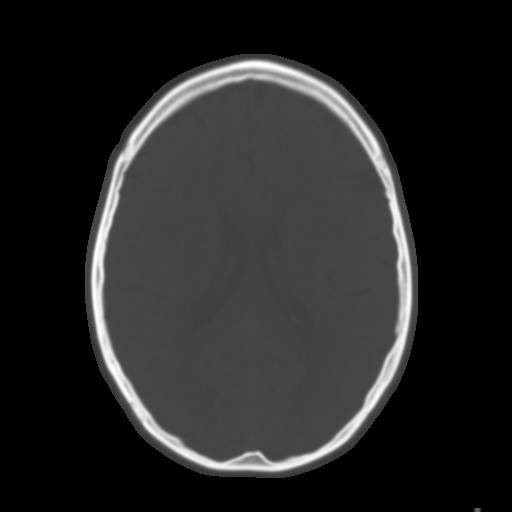
[im 19/31  brain]
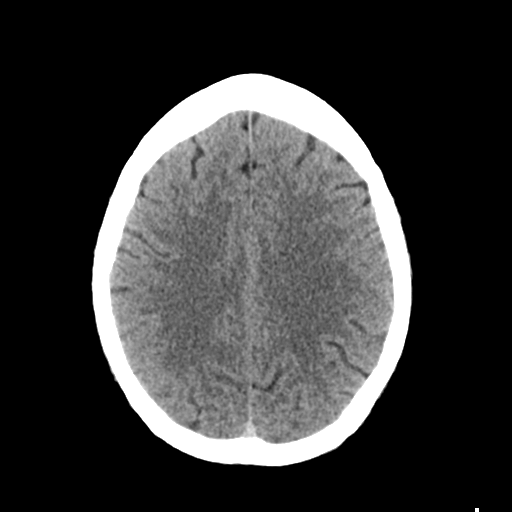
[im 22/31  brain]
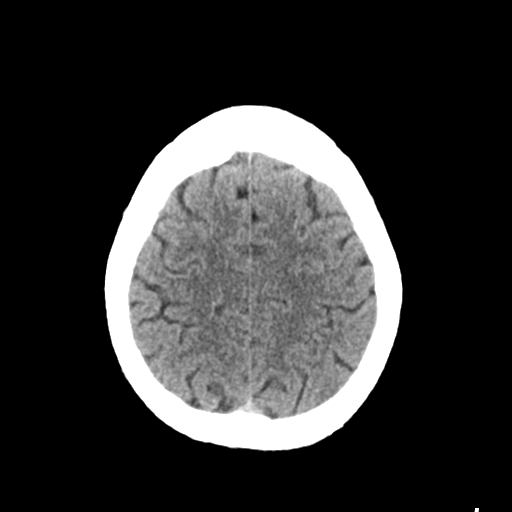
[im 25/31  brain]
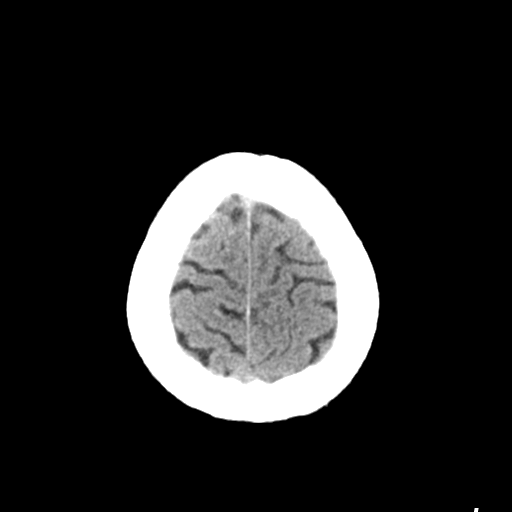
[im 28/31  brain]
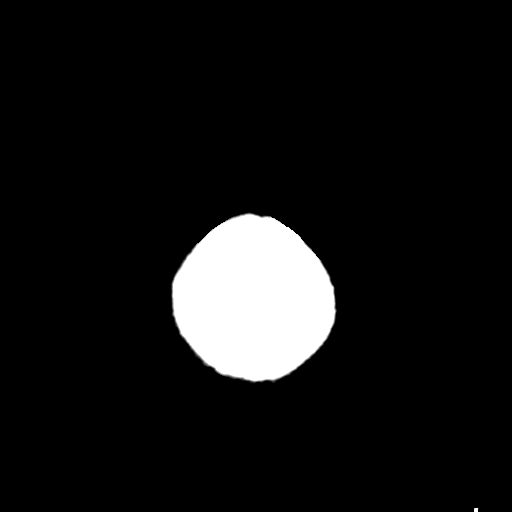
[im 28/31  bone]
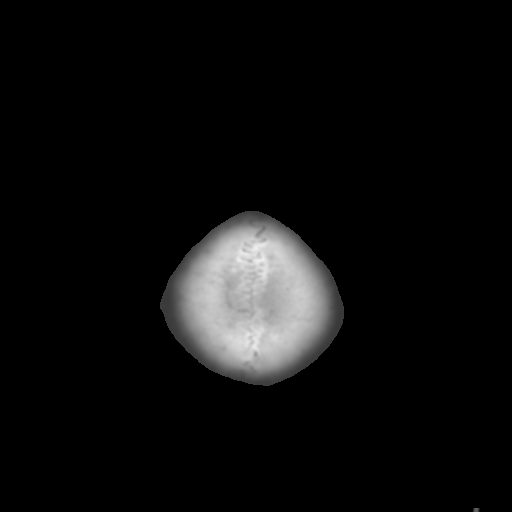

[Series 5: sagittal soft tissue · sagittal · 0.31mm/px · 3 of 56 slices shown]
[im 19/56  brain]
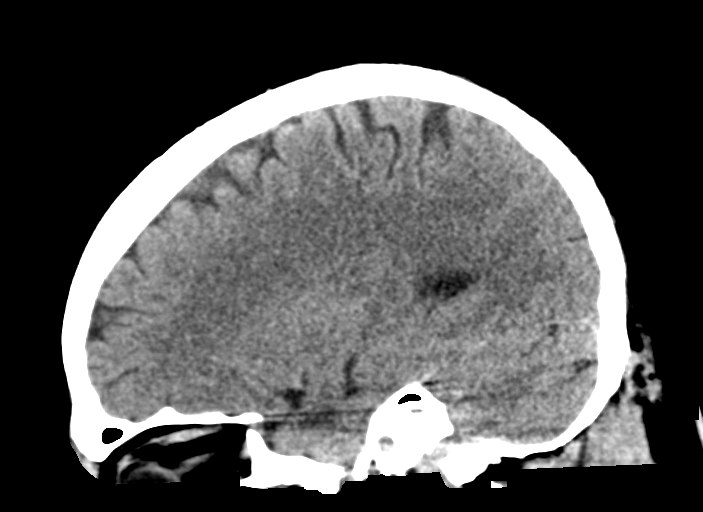
[im 28/56  brain]
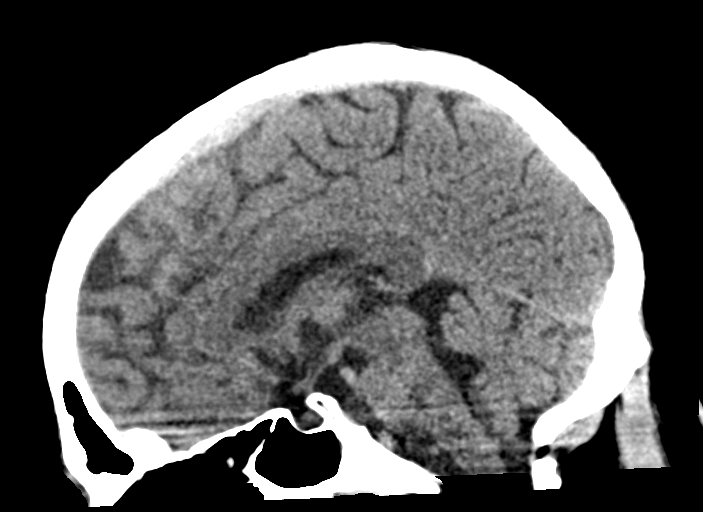
[im 37/56  brain]
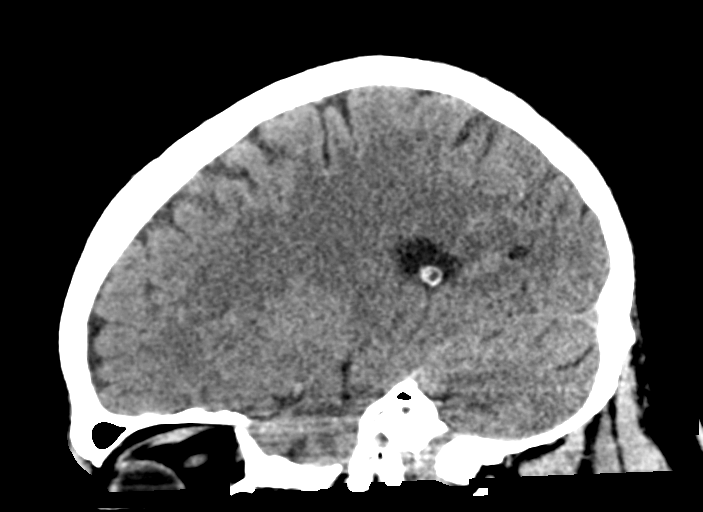

[15 of 47 positions shown; findings below may reference images not displayed]

FINDINGS: Brain: Brain volume is normal for age. No intracranial hemorrhage,
mass effect, or midline shift. No hydrocephalus. The basilar
cisterns are patent. No evidence of territorial infarct or acute
ischemia. No extra-axial or intracranial fluid collection.

Vascular: No hyperdense vessel.

Skull: No fracture or focal lesion.

Sinuses/Orbits: Minimal mucosal thickening of left side of sphenoid
sinus. Remaining paranasal sinuses are clear. Included orbits are
unremarkable. Mastoid air cells are well aerated.

Other: None.
IMPRESSION: Negative noncontrast head CT.

## 2021-10-22 NOTE — Progress Notes (Signed)
I,Sulibeya S Dimas,acting as a Neurosurgeon for Jacky Kindle, FNP.,have documented all relevant documentation on the behalf of Jacky Kindle, FNP,as directed by  Jacky Kindle, FNP while in the presence of Jacky Kindle, FNP.   Complete physical exam   Patient: Jeffrey Zamora   DOB: 1991/02/22   30 y.o. Male  MRN: [id number] Visit Date: 10/26/2021  Today's healthcare provider: Jacky Kindle, FNP  Introduced to nurse practitioner role and practice setting.  All questions answered.  Discussed provider/patient relationship and expectations.   Chief Complaint  Patient presents with   Annual Exam   Subjective    Jeffrey Zamora is a 31 y.o. male who presents today for a complete physical exam.  He reports consuming a general diet. The patient does not participate in regular exercise at present. He generally feels well. He reports sleeping well. He does not have additional problems to discuss today.  HPI    History reviewed. No pertinent past medical history. History reviewed. No pertinent surgical history. Social History   Socioeconomic History   Marital status: Single    Spouse name: Not on file   Number of children: Not on file   Years of education: Not on file   Highest education level: Not on file  Occupational History   Not on file  Tobacco Use   Smoking status: Never   Smokeless tobacco: Never  Substance and Sexual Activity   Alcohol use: Not Currently   Drug use: Never   Sexual activity: Not Currently  Other Topics Concern   Not on file  Social History Narrative   Not on file   Social Determinants of Health   Financial Resource Strain: Not on file  Food Insecurity: Not on file  Transportation Needs: Not on file  Physical Activity: Not on file  Stress: Not on file  Social Connections: Not on file  Intimate Partner Violence: Not on file   Family Status  Relation Name Status   Mother  Alive   Father  Alive   Sister  Alive   Brother  Alive   MGM  Alive    MGF  Deceased   PGM  Deceased   PGF  Deceased   History reviewed. No pertinent family history. No Known Allergies  Patient Care Team: Jacky Kindle, FNP as PCP - General (Family Medicine)   Medications: Outpatient Medications Prior to Visit  Medication Sig   losartan-hydrochlorothiazide (HYZAAR) 50-12.5 MG tablet Take 1 tablet by mouth daily.   No facility-administered medications prior to visit.    Review of Systems  All other systems reviewed and are negative.   Last CBC Lab Results  Component Value Date   WBC 7.7 12/19/2020   HGB 16.0 12/19/2020   HCT 45.5 12/19/2020   MCV 89.0 12/19/2020   MCH 31.3 12/19/2020   RDW 12.2 12/19/2020   PLT 240 12/19/2020   Last metabolic panel Lab Results  Component Value Date   GLUCOSE 108 (H) 12/19/2020   NA 136 12/19/2020   K 3.8 12/19/2020   CL 99 12/19/2020   CO2 28 12/19/2020   BUN 13 12/19/2020   CREATININE 0.93 12/19/2020   GFRNONAA >60 12/19/2020   CALCIUM 9.9 12/19/2020   PROT 8.0 12/19/2020   ALBUMIN 4.4 12/19/2020   LABGLOB 2.6 05/19/2020   AGRATIO 1.9 05/19/2020   BILITOT 1.0 12/19/2020   ALKPHOS 58 12/19/2020   AST 26 12/19/2020   ALT 45 (H) 12/19/2020   ANIONGAP  9 12/19/2020   Last lipids Lab Results  Component Value Date   CHOL 172 05/19/2020   HDL 33 (L) 05/19/2020   LDLCALC 100 (H) 05/19/2020   TRIG 225 (H) 05/19/2020   CHOLHDL 5.2 (H) 05/19/2020   Last thyroid functions Lab Results  Component Value Date   TSH 2.410 05/19/2020      Objective     BP 139/85 (BP Location: Right Arm, Patient Position: Sitting, Cuff Size: Large)   Pulse 97   Temp 98 F (36.7 C) (Oral)   Resp 16   Ht 6' (1.829 m)   Wt 269 lb 3.2 oz (122.1 kg)   BMI 36.51 kg/m  BP Readings from Last 3 Encounters:  10/26/21 139/85  08/26/21 (!) 154/94  12/19/20 (!) 160/94   Wt Readings from Last 3 Encounters:  10/26/21 269 lb 3.2 oz (122.1 kg)  08/26/21 269 lb 12.8 oz (122.4 kg)  12/19/20 260 lb (117.9 kg)     Physical Exam Vitals and nursing note reviewed.  Constitutional:      General: He is awake. He is not in acute distress.    Appearance: Normal appearance. He is well-developed and well-groomed. He is obese. He is not ill-appearing, toxic-appearing or diaphoretic.  HENT:     Head: Normocephalic and atraumatic.     Jaw: There is normal jaw occlusion. No trismus, tenderness, swelling or pain on movement.     Salivary Glands: Right salivary gland is not diffusely enlarged or tender. Left salivary gland is not diffusely enlarged or tender.     Right Ear: Hearing, tympanic membrane, ear canal and external ear normal. There is no impacted cerumen.     Left Ear: Hearing, tympanic membrane, ear canal and external ear normal. There is no impacted cerumen.     Nose: Nose normal. No congestion or rhinorrhea.     Right Turbinates: Not enlarged, swollen or pale.     Left Turbinates: Not enlarged, swollen or pale.     Right Sinus: No maxillary sinus tenderness or frontal sinus tenderness.     Left Sinus: No maxillary sinus tenderness or frontal sinus tenderness.     Mouth/Throat:     Lips: Pink.     Mouth: Mucous membranes are moist. No injury, lacerations, oral lesions or angioedema.     Pharynx: Oropharynx is clear. Uvula midline. No pharyngeal swelling, oropharyngeal exudate or posterior oropharyngeal erythema.     Tonsils: No tonsillar exudate or tonsillar abscesses.  Eyes:     General: Lids are normal. Vision grossly intact. Gaze aligned appropriately.        Right eye: No discharge.        Left eye: No discharge.     Extraocular Movements: Extraocular movements intact.     Conjunctiva/sclera: Conjunctivae normal.     Pupils: Pupils are equal, round, and reactive to light.  Neck:     Thyroid: No thyroid mass, thyromegaly or thyroid tenderness.     Vascular: No carotid bruit.     Trachea: Trachea normal. No tracheal tenderness.  Cardiovascular:     Rate and Rhythm: Normal rate and regular  rhythm.     Pulses: Normal pulses.          Carotid pulses are 2+ on the right side and 2+ on the left side.      Radial pulses are 2+ on the right side and 2+ on the left side.       Femoral pulses are 2+ on the right side and  2+ on the left side.      Popliteal pulses are 2+ on the right side and 2+ on the left side.       Dorsalis pedis pulses are 2+ on the right side and 2+ on the left side.       Posterior tibial pulses are 2+ on the right side and 2+ on the left side.     Heart sounds: Normal heart sounds, S1 normal and S2 normal. No murmur heard.    No friction rub. No gallop.  Pulmonary:     Effort: Pulmonary effort is normal. No respiratory distress.     Breath sounds: Normal breath sounds and air entry. No stridor. No wheezing, rhonchi or rales.  Chest:     Chest wall: No tenderness.  Abdominal:     General: Abdomen is flat. Bowel sounds are normal. There is no distension.     Palpations: Abdomen is soft. There is no mass.     Tenderness: There is no abdominal tenderness. There is no guarding or rebound.     Hernia: No hernia is present.  Genitourinary:    Comments: Exam deferred; denies complaints Musculoskeletal:        General: No swelling, tenderness, deformity or signs of injury. Normal range of motion.     Cervical back: Normal range of motion and neck supple. No rigidity or tenderness.     Right lower leg: No edema.     Left lower leg: No edema.  Lymphadenopathy:     Cervical: No cervical adenopathy.     Right cervical: No superficial, deep or posterior cervical adenopathy.    Left cervical: No superficial, deep or posterior cervical adenopathy.  Skin:    General: Skin is warm and dry.     Capillary Refill: Capillary refill takes less than 2 seconds.     Coloration: Skin is not jaundiced or pale.     Findings: No bruising, erythema, lesion or rash.  Neurological:     General: No focal deficit present.     Mental Status: He is alert and oriented to person, place,  and time. Mental status is at baseline.     GCS: GCS eye subscore is 4. GCS verbal subscore is 5. GCS motor subscore is 6.     Sensory: Sensation is intact. No sensory deficit.     Motor: Motor function is intact. No weakness.     Coordination: Coordination is intact.     Gait: Gait is intact.  Psychiatric:        Attention and Perception: Attention and perception normal.        Mood and Affect: Mood and affect normal.        Speech: Speech normal.        Behavior: Behavior normal. Behavior is cooperative.        Thought Content: Thought content normal.        Cognition and Memory: Cognition normal.        Judgment: Judgment normal.     Last depression screening scores    08/26/2021    3:14 PM 05/19/2020   10:31 AM  PHQ 2/9 Scores  PHQ - 2 Score 0 3  PHQ- 9 Score 2 16   Last fall risk screening    08/26/2021    3:14 PM  Fall Risk   Falls in the past year? 0  Number falls in past yr: 0  Injury with Fall? 0   Last Audit-C alcohol use screening  08/26/2021    3:14 PM  Alcohol Use Disorder Test (AUDIT)  1. How often do you have a drink containing alcohol? 0  2. How many drinks containing alcohol do you have on a typical day when you are drinking? 0  3. How often do you have six or more drinks on one occasion? 0  AUDIT-C Score 0   A score of 3 or more in women, and 4 or more in men indicates increased risk for alcohol abuse, EXCEPT if all of the points are from question 1   No results found for any visits on 10/26/21.  Assessment & Plan    Routine Health Maintenance and Physical Exam  Exercise Activities and Dietary recommendations  Goals   None     Immunization History  Administered Date(s) Administered   DTaP 04/17/1991, 05/01/1991, 11/07/1991, 06/23/1992, 07/18/1995   Hepatitis B 04/03/1991, 05/22/1991, 11/07/1991   HiB (PRP-OMP) 03/24/1992   IPV 04/03/1991, 05/22/1991, 11/07/1991, 06/23/1992, 07/18/1995   Tdap 10/26/2021   Varicella 11/30/1995    Health  Maintenance  Topic Date Due   COVID-19 Vaccine (1) Never done   INFLUENZA VACCINE  10/06/2021   TETANUS/TDAP  10/27/2031   Hepatitis C Screening  Completed   HIV Screening  Completed   HPV VACCINES  Aged Out    Discussed health benefits of physical activity, and encouraged him to engage in regular exercise appropriate for his age and condition.  Problem List Items Addressed This Visit       Cardiovascular and Mediastinum   Primary hypertension    Chronic, stable At goal, <140/<90; Continue Hyzaar 50-12.5 mg  Denies SE with medications Encouraged to call for refills in 02/2022        Other   Annual physical exam - Primary    Encouraged annual/bi-annual eye exam; reports redness in sclera from excess screen time, x1 day; concern for mid vision loss, could be related to fatigue from excess screen time; however, recommend f/u with eye MD Does not have a dental home; reports 1-2 times/day brushing; encouraged to brush qHS if unable to brush teeth 2x/day and recommend use of flossing x1 day Things to do to keep yourself healthy  - Exercise at least 30-45 minutes a day, 3-4 days a week.  - Eat a low-fat diet with lots of fruits and vegetables, up to 7-9 servings per day.  - Seatbelts can save your life. Wear them always.  - Smoke detectors on every level of your home, check batteries every year.  - Eye Doctor - have an eye exam every 1-2 years  - Safe sex - if you may be exposed to STDs, use a condom.  - Alcohol -  If you drink, do it moderately, less than 2 drinks per day.  - Health Care Power of Attorney. Choose someone to speak for you if you are not able.  - Depression is common in our stressful world.If you're feeling down or losing interest in things you normally enjoy, please come in for a visit.  - Violence - If anyone is threatening or hurting you, please call immediately.        Relevant Orders   Comprehensive metabolic panel   TSH + free T4   CBC with  Differential/Platelet   Lipid panel   Hemoglobin A1c   Body mass index (BMI) of 36.0-36.9 in adult    Chronic, stable Continue to recommend balanced, lower carb meals. Smaller meal size, adding snacks. Choosing water as drink of choice and  increasing purposeful exercise.       Elevated serum glucose    Recommend A1c screening given HTN and obesity; hx of elevated glucose >100        Relevant Orders   Hemoglobin A1c   Finger pain, right    Acute, x1 day, stable Reports concern for splinter; no visualization noted Encouraged to keep clean and monitor for removal of foreign body       Need for Tdap vaccination    Provided; VIS made available; consent received- no immediate reaction s/p immunization       Relevant Orders   Tdap vaccine greater than or equal to 7yo IM (Completed)   Screening for diabetes mellitus    Recommend A1c screening given HTN and obesity; hx of elevated glucose >100       Relevant Orders   Hemoglobin A1c   Vitamin D deficiency    Chronic, unknown Repeat Vit D Panel; reports use of ? Quantity of OTC vitamin D       Relevant Orders   Vitamin D (25 hydroxy)   Return in about 1 year (around 10/28/2022) for annual examination.     Leilani MerlI, Nathanie Ottley T Raiana Pharris, FNP, have reviewed all documentation for this visit. The documentation on 10/26/21 for the exam, diagnosis, procedures, and orders are all accurate and complete.  Jacky KindleElise T Tyjay Galindo, FNP  [facility] / [facility] [phone] (phone) [phone] (fax)  [facility]

## 2021-10-26 ENCOUNTER — Ambulatory Visit (INDEPENDENT_AMBULATORY_CARE_PROVIDER_SITE_OTHER): Payer: Commercial Managed Care - PPO | Admitting: Family Medicine

## 2021-10-26 ENCOUNTER — Encounter: Payer: Self-pay | Admitting: Family Medicine

## 2021-10-26 VITALS — BP 139/85 | HR 97 | Temp 98.0°F | Resp 16 | Ht 72.0 in | Wt 269.2 lb

## 2021-10-26 DIAGNOSIS — Z23 Encounter for immunization: Secondary | ICD-10-CM | POA: Diagnosis not present

## 2021-10-26 DIAGNOSIS — I10 Essential (primary) hypertension: Secondary | ICD-10-CM

## 2021-10-26 DIAGNOSIS — Z0000 Encounter for general adult medical examination without abnormal findings: Secondary | ICD-10-CM | POA: Diagnosis not present

## 2021-10-26 DIAGNOSIS — R739 Hyperglycemia, unspecified: Secondary | ICD-10-CM | POA: Insufficient documentation

## 2021-10-26 DIAGNOSIS — E559 Vitamin D deficiency, unspecified: Secondary | ICD-10-CM

## 2021-10-26 DIAGNOSIS — Z6836 Body mass index (BMI) 36.0-36.9, adult: Secondary | ICD-10-CM

## 2021-10-26 DIAGNOSIS — Z131 Encounter for screening for diabetes mellitus: Secondary | ICD-10-CM | POA: Insufficient documentation

## 2021-10-26 DIAGNOSIS — M79644 Pain in right finger(s): Secondary | ICD-10-CM | POA: Insufficient documentation

## 2021-10-26 NOTE — Assessment & Plan Note (Signed)
Recommend A1c screening given HTN and obesity; hx of elevated glucose >100

## 2021-10-26 NOTE — Assessment & Plan Note (Signed)
Encouraged annual/bi-annual eye exam; reports redness in sclera from excess screen time, x1 day; concern for mid vision loss, could be related to fatigue from excess screen time; however, recommend f/u with eye MD Does not have a dental home; reports 1-2 times/day brushing; encouraged to brush qHS if unable to brush teeth 2x/day and recommend use of flossing x1 day Things to do to keep yourself healthy  - Exercise at least 30-45 minutes a day, 3-4 days a week.  - Eat a low-fat diet with lots of fruits and vegetables, up to 7-9 servings per day.  - Seatbelts can save your life. Wear them always.  - Smoke detectors on every level of your home, check batteries every year.  - Eye Doctor - have an eye exam every 1-2 years  - Safe sex - if you may be exposed to STDs, use a condom.  - Alcohol -  If you drink, do it moderately, less than 2 drinks per day.  - Health Care Power of Attorney. Choose someone to speak for you if you are not able.  - Depression is common in our stressful world.If you're feeling down or losing interest in things you normally enjoy, please come in for a visit.  - Violence - If anyone is threatening or hurting you, please call immediately.

## 2021-10-26 NOTE — Assessment & Plan Note (Addendum)
Chronic, stable At goal, <140/<90; Continue Hyzaar 50-12.5 mg  Denies SE with medications Encouraged to call for refills in 02/2022

## 2021-10-26 NOTE — Assessment & Plan Note (Signed)
Chronic, stable Continue to recommend balanced, lower carb meals. Smaller meal size, adding snacks. Choosing water as drink of choice and increasing purposeful exercise.  

## 2021-10-26 NOTE — Assessment & Plan Note (Signed)
Chronic, unknown Repeat Vit D Panel; reports use of ? Quantity of OTC vitamin D

## 2021-10-26 NOTE — Assessment & Plan Note (Signed)
Provided; VIS made available; consent received- no immediate reaction s/p immunization

## 2021-10-26 NOTE — Assessment & Plan Note (Signed)
Recommend A1c screening given HTN and obesity; hx of elevated glucose >100   

## 2021-10-26 NOTE — Assessment & Plan Note (Signed)
Acute, x1 day, stable Reports concern for splinter; no visualization noted Encouraged to keep clean and monitor for removal of foreign body

## 2021-10-27 ENCOUNTER — Telehealth: Payer: Self-pay

## 2021-10-27 LAB — COMPREHENSIVE METABOLIC PANEL
ALT: 58 IU/L — ABNORMAL HIGH (ref 0–44)
AST: 27 IU/L (ref 0–40)
Albumin/Globulin Ratio: 1.4 (ref 1.2–2.2)
Albumin: 4.5 g/dL (ref 4.3–5.2)
Alkaline Phosphatase: 69 IU/L (ref 44–121)
BUN/Creatinine Ratio: 9 (ref 9–20)
BUN: 9 mg/dL (ref 6–20)
Bilirubin Total: 0.5 mg/dL (ref 0.0–1.2)
CO2: 22 mmol/L (ref 20–29)
Calcium: 9.2 mg/dL (ref 8.7–10.2)
Chloride: 99 mmol/L (ref 96–106)
Creatinine, Ser: 0.97 mg/dL (ref 0.76–1.27)
Globulin, Total: 3.2 g/dL (ref 1.5–4.5)
Glucose: 87 mg/dL (ref 70–99)
Potassium: 3.8 mmol/L (ref 3.5–5.2)
Sodium: 137 mmol/L (ref 134–144)
Total Protein: 7.7 g/dL (ref 6.0–8.5)
eGFR: 108 mL/min/{1.73_m2} (ref >59)

## 2021-10-27 LAB — TSH+FREE T4
Free T4: 1.28 ng/dL (ref 0.82–1.77)
TSH: 3.99 u[IU]/mL (ref 0.450–4.500)

## 2021-10-27 LAB — VITAMIN D 25 HYDROXY (VIT D DEFICIENCY, FRACTURES): Vit D, 25-Hydroxy: 21.1 ng/mL — ABNORMAL LOW (ref 30.0–100.0)

## 2021-10-27 LAB — HEMOGLOBIN A1C
Est. average glucose Bld gHb Est-mCnc: 114 mg/dL
Hgb A1c MFr Bld: 5.6 % (ref 4.8–5.6)

## 2021-10-27 LAB — CBC WITH DIFFERENTIAL/PLATELET
Basophils Absolute: 0 10*3/uL (ref 0.0–0.2)
Basos: 1 %
EOS (ABSOLUTE): 0.1 10*3/uL (ref 0.0–0.4)
Eos: 1 %
Hematocrit: 44.1 % (ref 37.5–51.0)
Hemoglobin: 15.4 g/dL (ref 13.0–17.7)
Immature Grans (Abs): 0 10*3/uL (ref 0.0–0.1)
Immature Granulocytes: 1 %
Lymphocytes Absolute: 2 10*3/uL (ref 0.7–3.1)
Lymphs: 32 %
MCH: 30.3 pg (ref 26.6–33.0)
MCHC: 34.9 g/dL (ref 31.5–35.7)
MCV: 87 fL (ref 79–97)
Monocytes Absolute: 1.3 10*3/uL — ABNORMAL HIGH (ref 0.1–0.9)
Monocytes: 20 %
Neutrophils Absolute: 2.9 10*3/uL (ref 1.4–7.0)
Neutrophils: 45 %
Platelets: 211 10*3/uL (ref 150–450)
RBC: 5.08 x10E6/uL (ref 4.14–5.80)
RDW: 11.2 % — ABNORMAL LOW (ref 11.6–15.4)
WBC: 6.3 10*3/uL (ref 3.4–10.8)

## 2021-10-27 LAB — LIPID PANEL
Chol/HDL Ratio: 4.3 ratio (ref 0.0–5.0)
Cholesterol, Total: 132 mg/dL (ref 100–199)
HDL: 31 mg/dL — ABNORMAL LOW (ref >39)
LDL Chol Calc (NIH): 70 mg/dL (ref 0–99)
Triglycerides: 182 mg/dL — ABNORMAL HIGH (ref 0–149)
VLDL Cholesterol Cal: 31 mg/dL (ref 5–40)

## 2021-10-27 NOTE — Telephone Encounter (Signed)
Pt returned call for lab results. Shared provider's note with pt.  No questions at this time.  Pt will continue with OTC vit D and increase the dosage to 5000. Jacky Kindle, FNP  10/27/2021 11:53 AM EDT     Stable blood chemistry; elevation remains in liver enzyme, ALT. I recommend diet low in saturated fat and regular exercise - 30 min at least 5 times per week   Cholesterol is improved; fats remain elevated. Good/HDL cholesterol remains low.   Continue to recommend high dose Vit D vs OTC- if wishing to continue OTC, recommend 5000 IU/day.   All other labs normal/stable.   Jacky Kindle, FNP  [facility] [address] [place], [place] [address] [phone] (phone) [phone] (fax) [facility]

## 2021-10-27 NOTE — Progress Notes (Signed)
Stable blood chemistry; elevation remains in liver enzyme, ALT. I recommend diet low in saturated fat and regular exercise - 30 min at least 5 times per week  Cholesterol is improved; fats remain elevated. Good/HDL cholesterol remains low.  Continue to recommend high dose Vit D vs OTC- if wishing to continue OTC, recommend 5000 IU/day.  All other labs normal/stable.  Jacky Kindle, FNP  [facility] [address] [place], [place] [address] [phone] (phone) [phone] (fax) [facility]

## 2022-07-14 IMAGING — CR DG CHEST 2V
2 series · 2 of 2 positions shown · non-contrast
Comparison: None.

CLINICAL DATA: Chest pain

EXAM:
CHEST - 2 VIEW

[chest pa]
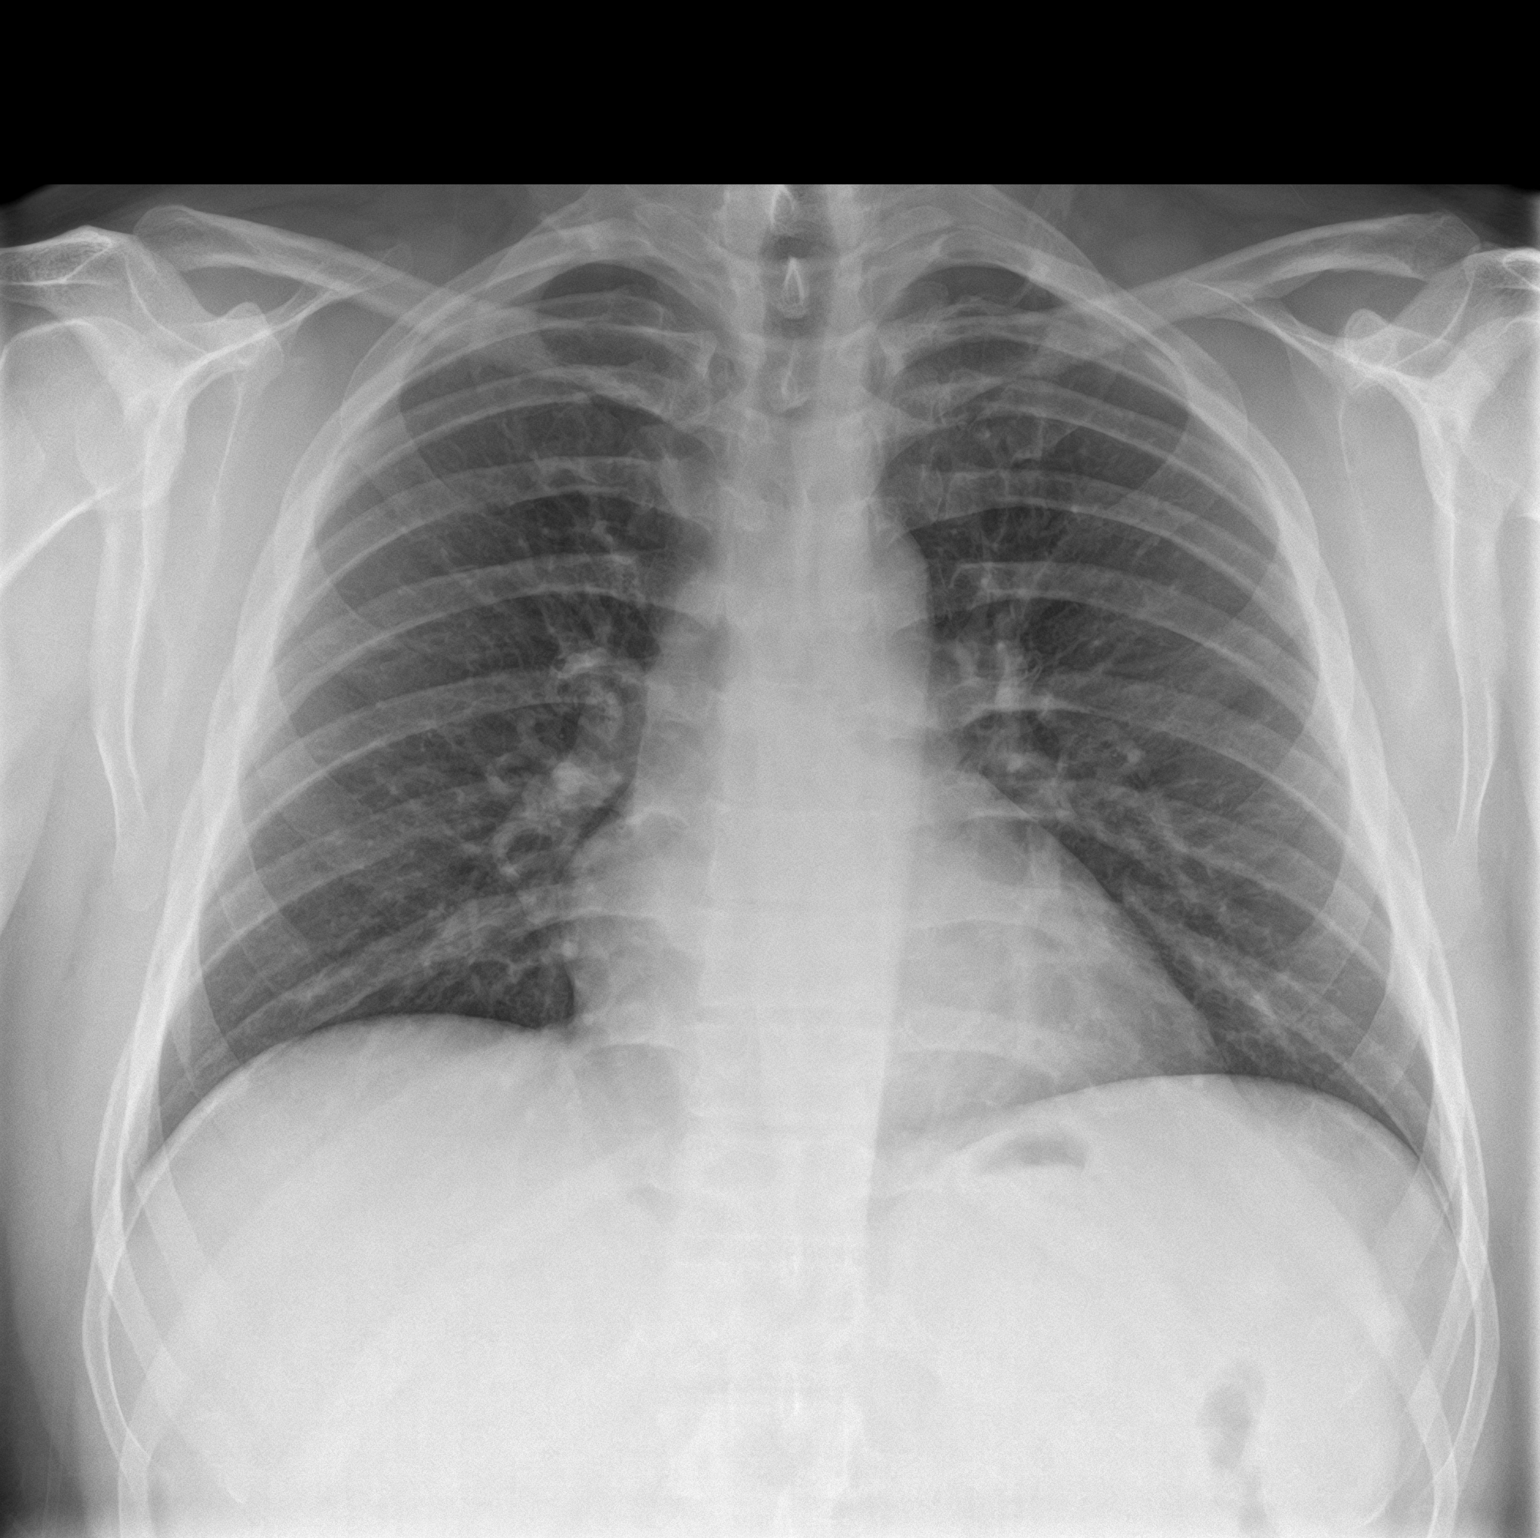

[chest lat]
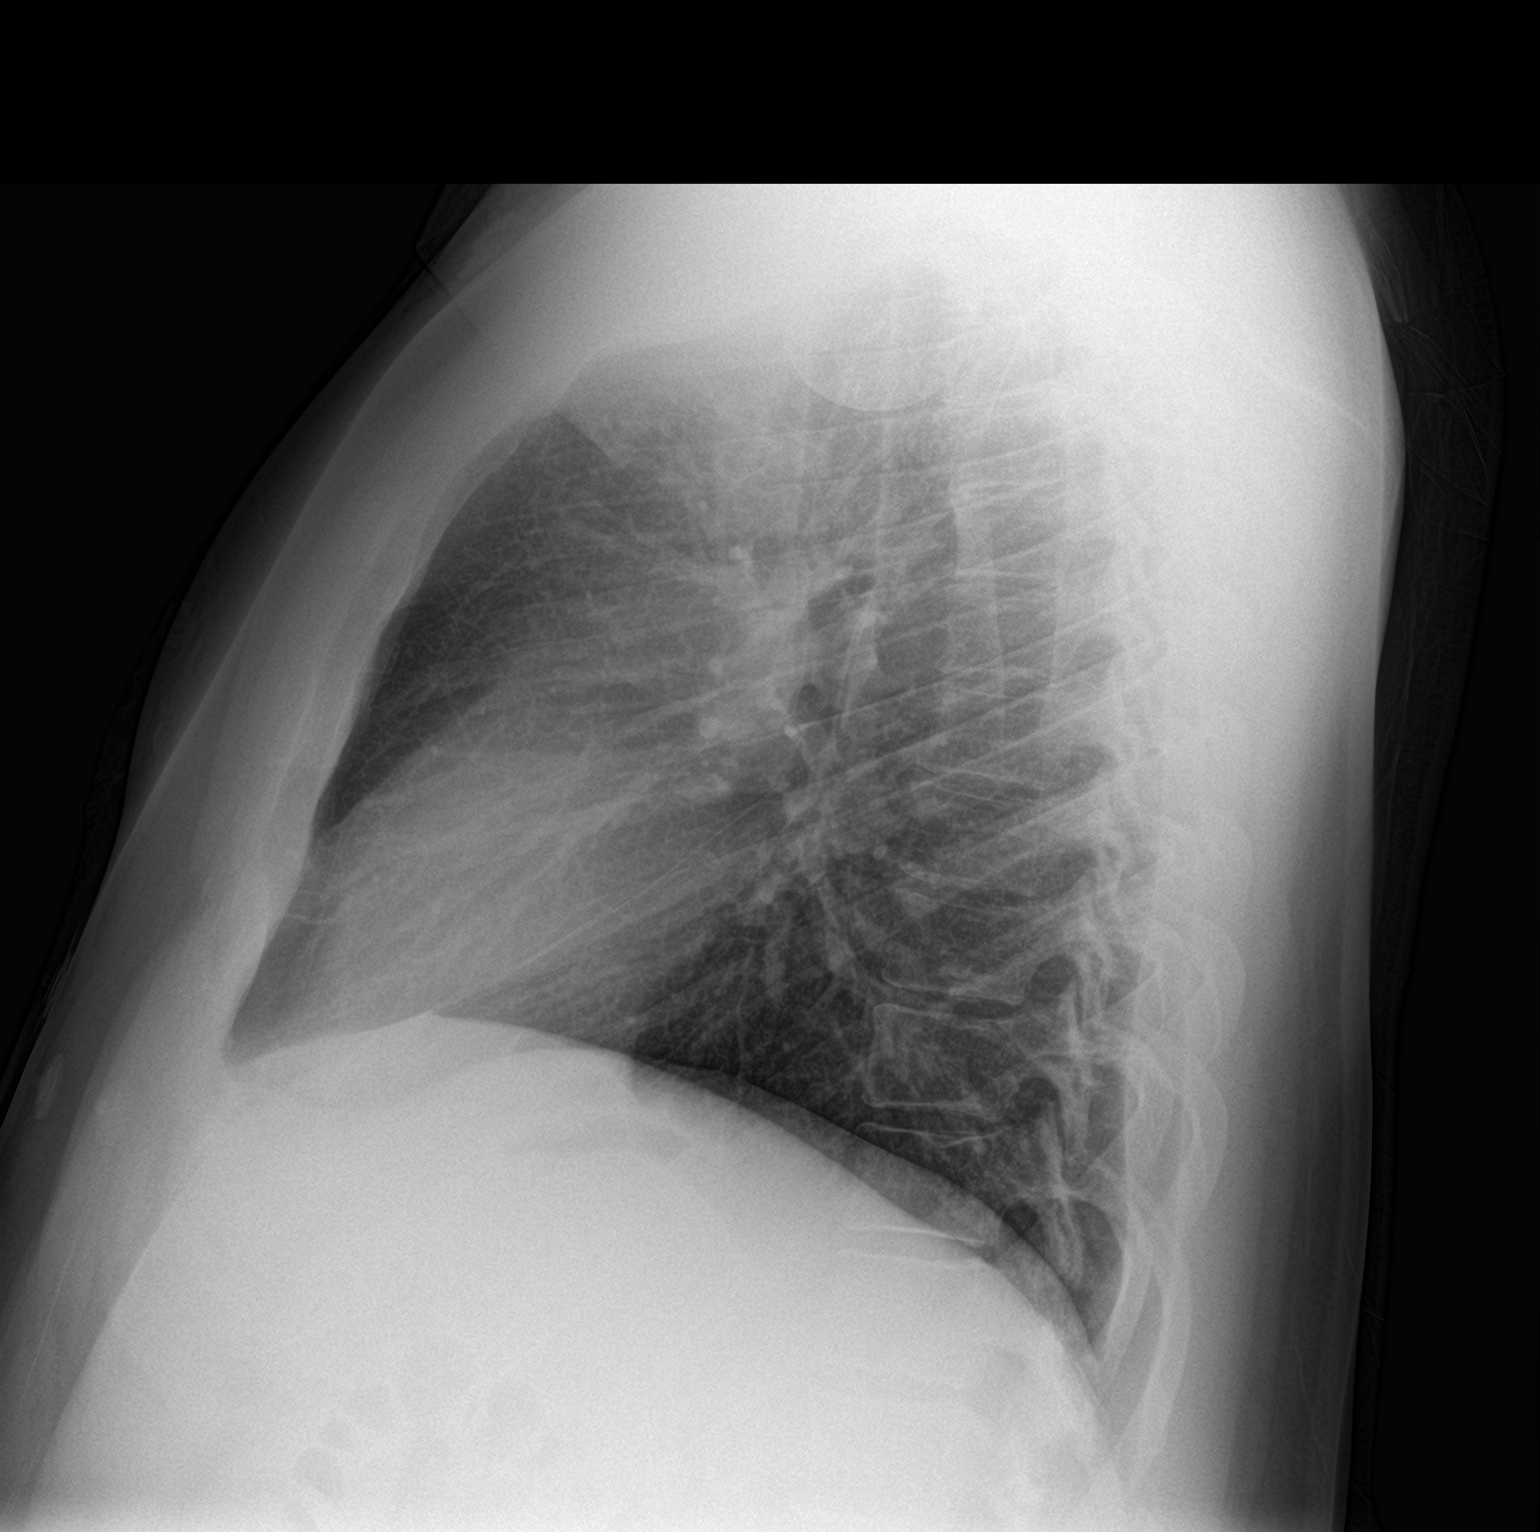

[2 of 2 positions shown; findings below may reference images not displayed]

FINDINGS: The heart size and mediastinal contours are within normal limits.
Both lungs are clear. No pleural effusion. No pneumothorax. The
visualized skeletal structures are unremarkable.
IMPRESSION: No acute process in the chest.

## 2023-08-26 ENCOUNTER — Other Ambulatory Visit: Payer: Self-pay

## 2023-08-26 ENCOUNTER — Observation Stay
Admission: EM | Admit: 2023-08-26 | Discharge: 2023-08-27 | Disposition: A | Discharge status: Home / Self Care | Attending: Emergency Medicine | Admitting: Emergency Medicine

## 2023-08-26 DIAGNOSIS — W5911XA Bitten by nonvenomous snake, initial encounter: Principal | ICD-10-CM

## 2023-08-26 DIAGNOSIS — D72829 Elevated white blood cell count, unspecified: Secondary | ICD-10-CM | POA: Insufficient documentation

## 2023-08-26 DIAGNOSIS — T63001A Toxic effect of unspecified snake venom, accidental (unintentional), initial encounter: Secondary | ICD-10-CM | POA: Diagnosis present

## 2023-08-26 DIAGNOSIS — Z23 Encounter for immunization: Secondary | ICD-10-CM | POA: Insufficient documentation

## 2023-08-26 DIAGNOSIS — E66812 Obesity, class 2: Secondary | ICD-10-CM | POA: Insufficient documentation

## 2023-08-26 DIAGNOSIS — I10 Essential (primary) hypertension: Secondary | ICD-10-CM | POA: Diagnosis present

## 2023-08-26 DIAGNOSIS — Z6836 Body mass index (BMI) 36.0-36.9, adult: Secondary | ICD-10-CM | POA: Insufficient documentation

## 2023-08-26 HISTORY — DX: Essential (primary) hypertension: I10

## 2023-08-26 LAB — APTT: aPTT: 30 s (ref 24–36)

## 2023-08-26 LAB — COMPREHENSIVE METABOLIC PANEL WITH GFR
ALT: 34 U/L (ref 0–44)
AST: 27 U/L (ref 15–41)
Albumin: 4 g/dL (ref 3.5–5.0)
Alkaline Phosphatase: 59 U/L (ref 38–126)
Anion gap: 8 (ref 5–15)
BUN: 13 mg/dL (ref 6–20)
CO2: 23 mmol/L (ref 22–32)
Calcium: 9 mg/dL (ref 8.9–10.3)
Chloride: 106 mmol/L (ref 98–111)
Creatinine, Ser: 0.87 mg/dL (ref 0.61–1.24)
GFR, Estimated: >60 mL/min (ref >60)
Glucose, Bld: 131 mg/dL — ABNORMAL HIGH (ref 70–99)
Potassium: 3.6 mmol/L (ref 3.5–5.1)
Sodium: 137 mmol/L (ref 135–145)
Total Bilirubin: 0.5 mg/dL (ref 0.0–1.2)
Total Protein: 7.1 g/dL (ref 6.5–8.1)

## 2023-08-26 LAB — CBC
HCT: 41.4 % (ref 39.0–52.0)
Hemoglobin: 14.3 g/dL (ref 13.0–17.0)
MCH: 30.6 pg (ref 26.0–34.0)
MCHC: 34.5 g/dL (ref 30.0–36.0)
MCV: 88.5 fL (ref 80.0–100.0)
Platelets: 246 10*3/uL (ref 150–400)
RBC: 4.68 MIL/uL (ref 4.22–5.81)
RDW: 12.1 % (ref 11.5–15.5)
WBC: 7.6 10*3/uL (ref 4.0–10.5)
nRBC: 0 % (ref 0.0–0.2)

## 2023-08-26 LAB — PROTIME-INR
INR: 0.9 (ref 0.8–1.2)
Prothrombin Time: 12.8 s (ref 11.4–15.2)

## 2023-08-26 LAB — FIBRINOGEN: Fibrinogen: 297 mg/dL (ref 210–475)

## 2023-08-26 MED ORDER — SODIUM CHLORIDE 0.9 % IV SOLN
4.0000 | Freq: Once | INTRAVENOUS | Status: AC
  Administered 2023-08-27: 4 via INTRAVENOUS
  Filled 2023-08-26: qty 72

## 2023-08-26 MED ORDER — MORPHINE SULFATE (PF) 4 MG/ML IV SOLN: 8.0000 mg | Freq: Once | INTRAVENOUS | Status: DC

## 2023-08-26 MED ORDER — MORPHINE SULFATE (PF) 4 MG/ML IV SOLN
4.0000 mg | Freq: Once | INTRAVENOUS | Status: AC
  Administered 2023-08-26: 4 mg via INTRAVENOUS
  Filled 2023-08-26: qty 1

## 2023-08-26 MED ORDER — MORPHINE SULFATE (PF) 4 MG/ML IV SOLN
6.0000 mg | Freq: Once | INTRAVENOUS | Status: AC
  Administered 2023-08-26: 6 mg via INTRAVENOUS
  Filled 2023-08-26: qty 2

## 2023-08-26 NOTE — ED Provider Notes (Signed)
 [facility] Provider Note    Event Date/Time   First MD Initiated Contact with Patient 08/26/23 2213     (approximate)   History   Snake Bite   HPI  Jeffrey Zamora is a 33 y.o. male no significant past medical history who presents to the emergency department with snakebite.  Patient states that he is uncertain of what bit him but he was bit in the backyard tonight on his right foot.  Had a puncture wound.  Was uncertain of what type of snake bit him.  States that he has throbbing pain to his foot.  Tetanus is up-to-date.  Denies any other further bites.  Does not take any daily medications.     Physical Exam   Triage Vital Signs: ED Triage Vitals  Encounter Vitals Group     BP      Girls Systolic BP Percentile      Girls Diastolic BP Percentile      Boys Systolic BP Percentile      Boys Diastolic BP Percentile      Pulse      Resp      Temp      Temp src      SpO2      Weight      Height      Head Circumference      Peak Flow      Pain Score      Pain Loc      Pain Education      Exclude from Growth Chart     Most recent vital signs: Vitals:   08/26/23 2300 08/26/23 2330  BP: (!) 142/97 (!) 133/91  Pulse: (!) 101 96  Resp: 18 (!) 22  Temp:    SpO2: (!) 87% 96%    Physical Exam Constitutional:      Appearance: He is well-developed.  HENT:     Head: Atraumatic.   Eyes:     Conjunctiva/sclera: Conjunctivae normal.    Cardiovascular:     Rate and Rhythm: Regular rhythm.  Pulmonary:     Effort: No respiratory distress.   Musculoskeletal:     Cervical back: Normal range of motion.   Skin:    General: Skin is warm.     Comments: Puncture wound to the right great toe with ecchymosis.  Swelling of the right great toe.  Does not extend up to the right foot or ankle.   Neurological:     Mental Status: He is alert. Mental status is at baseline.     IMPRESSION / MDM / ASSESSMENT AND PLAN / ED COURSE  I reviewed the triage  vital signs and the nursing notes.  Differential diagnosis including snakebite with envenomation versus nonvenomous snake bite  LABS (all labs ordered are listed, but only abnormal results are displayed) Labs interpreted as -    Labs Reviewed  COMPREHENSIVE METABOLIC PANEL WITH GFR - Abnormal; Notable for the following components:      Result Value   Glucose, Bld 131 (*)    All other components within normal limits  CBC  PROTIME-INR  APTT  FIBRINOGEN     MDM  Plan for lab work and coags.  Area of bite was marked with a skin marker.  Given IV morphine for pain control.  Will reach out to poison control.  On reevaluation continues to have severe pain and rates it a 7/10 despite multiple doses of IV morphine.  Swelling has increased to  the midfoot.  Ordered antivenom with CroFab.  Given another dose of morphine.  Will monitor and Poison control was notified.  Care transferred to incoming provider.     PROCEDURES:  Critical Care performed: yes  .Critical Care  Performed by: Viviano Ground, MD Authorized by: Viviano Ground, MD   Critical care provider statement:    Critical care time (minutes):  30   Critical care time was exclusive of:  Separately billable procedures and treating other patients   Critical care was necessary to treat or prevent imminent or life-threatening deterioration of the following conditions: Snakebite requiring antivenom.   Critical care was time spent personally by me on the following activities:  Development of treatment plan with patient or surrogate, discussions with consultants, evaluation of patient's response to treatment, examination of patient, ordering and review of laboratory studies, ordering and review of radiographic studies, ordering and performing treatments and interventions, pulse oximetry, re-evaluation of patient's condition and review of old charts   Patient's presentation is most consistent with acute presentation with potential  threat to life or bodily function.   MEDICATIONS ORDERED IN ED: Medications  crotalidae polyvalent immune fab (CROFAB) 4 vial in sodium chloride  0.9 % 250 mL infusion (has no administration in time range)  morphine (PF) 4 MG/ML injection 8 mg (has no administration in time range)  morphine (PF) 4 MG/ML injection 4 mg (4 mg Intravenous Given 08/26/23 2234)  morphine (PF) 4 MG/ML injection 6 mg (6 mg Intravenous Given 08/26/23 2319)    FINAL CLINICAL IMPRESSION(S) / ED DIAGNOSES   Final diagnoses:  Snake bite, initial encounter     Rx / DC Orders   ED Discharge Orders     None        Note:  This document was prepared using Dragon voice recognition software and may include unintentional dictation errors.   Viviano Ground, MD 08/26/23 [phone]

## 2023-08-26 NOTE — ED Triage Notes (Signed)
 Pt arrives via [facility] from home. Pt reports he was moving some stuff in his backyard tonight and suddenly felt something bite his foot. Puncture wounds noted to pt's right great toe with moderate swelling and blue discoloration. Serosanguinous weeping noted from puncture site. Pt thinks it is a snake bite d/t seeing snakes in the area the previous week and his dog recently being bit by one.

## 2023-08-27 DIAGNOSIS — T63001A Toxic effect of unspecified snake venom, accidental (unintentional), initial encounter: Principal | ICD-10-CM | POA: Diagnosis present

## 2023-08-27 LAB — PROTIME-INR
INR: 1 (ref 0.8–1.2)
Prothrombin Time: 13.8 s (ref 11.4–15.2)

## 2023-08-27 LAB — FIBRINOGEN: Fibrinogen: 276 mg/dL (ref 210–475)

## 2023-08-27 LAB — CBC
HCT: 41.9 % (ref 39.0–52.0)
Hemoglobin: 14 g/dL (ref 13.0–17.0)
MCH: 29.7 pg (ref 26.0–34.0)
MCHC: 33.4 g/dL (ref 30.0–36.0)
MCV: 89 fL (ref 80.0–100.0)
Platelets: 237 10*3/uL (ref 150–400)
RBC: 4.71 MIL/uL (ref 4.22–5.81)
RDW: 12.1 % (ref 11.5–15.5)
WBC: 12.2 10*3/uL — ABNORMAL HIGH (ref 4.0–10.5)
nRBC: 0 % (ref 0.0–0.2)

## 2023-08-27 LAB — HIV ANTIBODY (ROUTINE TESTING W REFLEX): HIV Screen 4th Generation wRfx: NONREACTIVE

## 2023-08-27 MED ORDER — AMLODIPINE BESYLATE 5 MG PO TABS
2.5000 mg | ORAL_TABLET | Freq: Every day | ORAL | Status: DC
  Administered 2023-08-27: 2.5 mg via ORAL
  Filled 2023-08-27: qty 1

## 2023-08-27 MED ORDER — HYDROCHLOROTHIAZIDE 12.5 MG PO TABS
12.5000 mg | ORAL_TABLET | Freq: Every day | ORAL | Status: DC
  Administered 2023-08-27: 12.5 mg via ORAL
  Filled 2023-08-27: qty 1

## 2023-08-27 MED ORDER — LOSARTAN POTASSIUM-HCTZ 50-12.5 MG PO TABS
1.0000 | ORAL_TABLET | Freq: Every day | ORAL | 1 refills | Status: DC
Start: 2023-08-27 — End: 2023-09-02

## 2023-08-27 MED ORDER — LOSARTAN POTASSIUM 50 MG PO TABS
50.0000 mg | ORAL_TABLET | Freq: Every day | ORAL | Status: DC
  Administered 2023-08-27: 50 mg via ORAL
  Filled 2023-08-27: qty 1

## 2023-08-27 MED ORDER — HYDROCODONE-ACETAMINOPHEN 5-325 MG PO TABS
1.0000 | ORAL_TABLET | ORAL | Status: DC | PRN
  Administered 2023-08-27: 1 via ORAL
  Filled 2023-08-27: qty 1

## 2023-08-27 MED ORDER — LOSARTAN POTASSIUM-HCTZ 50-12.5 MG PO TABS: 1.0000 | ORAL_TABLET | Freq: Every day | ORAL | Status: DC

## 2023-08-27 MED ORDER — ACETAMINOPHEN 650 MG RE SUPP: 650.0000 mg | Freq: Four times a day (QID) | RECTAL | Status: DC | PRN

## 2023-08-27 MED ORDER — AMLODIPINE BESYLATE 2.5 MG PO TABS: 2.5000 mg | ORAL_TABLET | Freq: Every day | ORAL | 2 refills | Status: AC

## 2023-08-27 MED ORDER — ONDANSETRON HCL 4 MG/2ML IJ SOLN: 4.0000 mg | Freq: Four times a day (QID) | INTRAMUSCULAR | Status: DC | PRN

## 2023-08-27 MED ORDER — ACETAMINOPHEN 325 MG PO TABS
650.0000 mg | ORAL_TABLET | Freq: Four times a day (QID) | ORAL | Status: DC | PRN
Start: 2023-08-27 — End: 2023-08-27

## 2023-08-27 MED ORDER — HYDROMORPHONE HCL 1 MG/ML IJ SOLN
1.0000 mg | INTRAMUSCULAR | Status: DC | PRN
  Administered 2023-08-27: 1 mg via INTRAVENOUS
  Filled 2023-08-27: qty 1

## 2023-08-27 MED ORDER — ONDANSETRON HCL 4 MG PO TABS
4.0000 mg | ORAL_TABLET | Freq: Four times a day (QID) | ORAL | Status: DC | PRN
Start: 2023-08-27 — End: 2023-08-27

## 2023-08-27 MED ORDER — HYDROCODONE-ACETAMINOPHEN 5-325 MG PO TABS: 1.0000 | ORAL_TABLET | ORAL | 0 refills | Status: DC | PRN

## 2023-08-27 MED ORDER — SODIUM CHLORIDE 0.9 % IV BOLUS
1000.0000 mL | Freq: Once | INTRAVENOUS | Status: AC
  Administered 2023-08-27: 1000 mL via INTRAVENOUS

## 2023-08-27 NOTE — Discharge Summary (Signed)
 Physician Discharge Summary  Jeffrey Zamora FMW:[id number] DOB: 12-22-1990 DOA: 08/26/2023  PCP: Emilio Kelly DASEN, FNP  Admit date: 08/26/2023  Discharge date: 08/27/2023  Admitted From:Home  Disposition:  Home  Recommendations for Outpatient Follow-up:  Follow up with PCP in 1-2 weeks and follow-up regarding BP Poison control to follow-up with patient with phone call Pain medications provided as needed Elevate leg until swelling subsides and avoid ice Refills on blood pressure medications provided  Home Health: None  Equipment/Devices: None  Discharge Condition:Stable  CODE STATUS: Full  Diet recommendation: Heart Healthy  Brief/Interim Summary: Jeffrey Zamora is a 33 y.o. male with medical history significant for hypertension who presented to the ED with concern for snakebite.  He was moving some stuff around in his backyard last night when he suddenly felt something bite his foot.  Patient was admitted due to concern for venomous snake bite and had swelling and throbbing pain to his foot.  He received 1 dose of CroFab  and was carefully monitored per poison control recommendations.  His swelling appears to have improved and is certainly not increasing and throbbing pain is improving as well.  He is able to ambulate at this point and eager for discharge.  Poison control recommends leg elevation and no use of ice.  He will remain on narcotic pain medications as needed as noted below.  Poison control will plan to follow-up with him via phone call in the near future.  He states he has been out of his home blood pressure medications for the last 2 months and has not followed up regarding this.  Refills have been prescribed at this time.  Discharge Diagnoses:  Principal Problem:   Poisonous snake bite, accidental or unintentional, initial encounter Active Problems:   Primary hypertension  Principal discharge diagnosis: Poisonous snake bite.  Discharge Instructions  Discharge  Instructions     Diet - low sodium heart healthy   Complete by: As directed    Increase activity slowly   Complete by: As directed       Allergies as of 08/27/2023   No Known Allergies      Medication List     STOP taking these medications    losartan  100 MG tablet Commonly known as: COZAAR        TAKE these medications    amLODipine  2.5 MG tablet Commonly known as: NORVASC  Take 1 tablet (2.5 mg total) by mouth daily.   HYDROcodone -acetaminophen  5-325 MG tablet Commonly known as: NORCO/VICODIN Take 1-2 tablets by mouth every 4 (four) hours as needed for moderate pain (pain score 4-6) or severe pain (pain score 7-10).   losartan -hydrochlorothiazide  50-12.5 MG tablet Commonly known as: HYZAAR Take 1 tablet by mouth daily.   triamcinolone cream 0.1 % Commonly known as: KENALOG Apply 1 Application topically 2 (two) times daily as needed.        Follow-up Information     Emilio Kelly DASEN, FNP. Schedule an appointment as soon as possible for a visit in 1 week(s).   Specialty: Family Medicine Contact information: [address] [place] [address] [phone]                No Known Allergies  Consultations: None   Procedures/Studies: No results found.   Discharge Exam: Vitals:   08/27/23 0243 08/27/23 0726  BP: (!) 159/96 (!) 142/90  Pulse: (!) 101 95  Resp: 16 18  Temp: 98.3 F (36.8 C) 98.8 F (37.1 C)  SpO2: 100% 98%   Vitals:  08/26/23 2300 08/26/23 2330 08/27/23 0243 08/27/23 0726  BP: (!) 142/97 (!) 133/91 (!) 159/96 (!) 142/90  Pulse: (!) 101 96 (!) 101 95  Resp: 18 (!) 22 16 18   Temp:   98.3 F (36.8 C) 98.8 F (37.1 C)  TempSrc:   Oral Oral  SpO2: (!) 87% 96% 100% 98%  Weight:      Height:        General: Pt is alert, awake, not in acute distress Cardiovascular: RRR, S1/S2 +, no rubs, no gallops Respiratory: CTA bilaterally, no wheezing, no rhonchi Abdominal: Soft, NT, ND, bowel sounds + Extremities: no  edema, no cyanosis    The results of significant diagnostics from this hospitalization (including imaging, microbiology, ancillary and laboratory) are listed below for reference.     Microbiology: No results found for this or any previous visit (from the past 240 hours).   Labs: BNP (last 3 results) No results for input(s): BNP in the last 8760 hours. Basic Metabolic Panel: Recent Labs  Lab 08/26/23 2228  NA 137  K 3.6  CL 106  CO2 23  GLUCOSE 131*  BUN 13  CREATININE 0.87  CALCIUM 9.0   Liver Function Tests: Recent Labs  Lab 08/26/23 2228  AST 27  ALT 34  ALKPHOS 59  BILITOT 0.5  PROT 7.1  ALBUMIN 4.0   No results for input(s): LIPASE, AMYLASE in the last 168 hours. No results for input(s): AMMONIA in the last 168 hours. CBC: Recent Labs  Lab 08/26/23 2228 08/27/23 0426  WBC 7.6 12.2*  HGB 14.3 14.0  HCT 41.4 41.9  MCV 88.5 89.0  PLT 246 237   Cardiac Enzymes: No results for input(s): CKTOTAL, CKMB, CKMBINDEX, TROPONINI in the last 168 hours. BNP: Invalid input(s): POCBNP CBG: No results for input(s): GLUCAP in the last 168 hours. D-Dimer No results for input(s): DDIMER in the last 72 hours. Hgb A1c No results for input(s): HGBA1C in the last 72 hours. Lipid Profile No results for input(s): CHOL, HDL, LDLCALC, TRIG, CHOLHDL, LDLDIRECT in the last 72 hours. Thyroid function studies No results for input(s): TSH, T4TOTAL, T3FREE, THYROIDAB in the last 72 hours.  Invalid input(s): FREET3 Anemia work up No results for input(s): VITAMINB12, FOLATE, FERRITIN, TIBC, IRON, RETICCTPCT in the last 72 hours. Urinalysis    Component Value Date/Time   BILIRUBINUR negative 05/19/2020 1045   PROTEINUR Negative 05/19/2020 1045   UROBILINOGEN 0.2 05/19/2020 1045   NITRITE negative 05/19/2020 1045   LEUKOCYTESUR Negative 05/19/2020 1045   Sepsis Labs Recent Labs  Lab 08/26/23 2228 08/27/23 0426   WBC 7.6 12.2*   Microbiology No results found for this or any previous visit (from the past 240 hours).   Time coordinating discharge: 35 minutes  SIGNED:   Adron JONETTA Fairly, DO [facility] 08/27/2023, 11:56 AM  If 7PM-7AM, please contact night-coverage [email or web address]

## 2023-08-27 NOTE — ED Notes (Signed)
 Right great toe noted to still be purplish/blue on inner aspect. Blistering noted at this time. Pt reports numbness to outer aspect of toe but sensation to inner aspect. Gauze present between toes to control drainage. Poison control verified that this is okay.

## 2023-08-27 NOTE — Progress Notes (Signed)
 Toes  21 cm Ankle 23 cm Calf    36 cm

## 2023-08-27 NOTE — Progress Notes (Signed)
 Toes 24cm,  Ankle 26cm Calf 44cm

## 2023-08-27 NOTE — H&P (Signed)
 History and Physical    Jeffrey Zamora FMW:[id number] DOB: 1990/04/03 DOA: 08/26/2023  PCP: Emilio Kelly DASEN, FNP   Patient coming from: Home  Chief Complaint: Snake bite  HPI: Jeffrey Zamora is a 33 y.o. male with medical history significant for hypertension who presented to the ED with concern for snakebite.  He was moving some stuff around in his backyard last night when he suddenly felt something bite his foot.  He is not sure exactly what bit him as he did not see it.  He was noted to have a puncture wound to his right great toe with moderate swelling and discoloration and was having throbbing pain to his foot.  There was also some serosanguineous weeping noted at the puncture site.  Patient feels that this is a snake bite due to seeing snakes in the area recently and last week his dog was also bitten by one.  He denies any other concerns or bites and states that his tetanus is up-to-date.   ED Course: Vital signs stable, with mildly elevated heart rate.  EDP had contacted poison control with recommendations to elevate patient's foot and trend circumference of his foot each hour while receiving CroFab .  He is supposed to be monitored for at least 6 hours after administration of CroFab  and should not receive morphine  due to possibility of histamine release.  His toe as well as ankle and calf swelling appears to be decreasing.  Review of Systems: Reviewed as noted above, otherwise negative.  Past Medical History:  Diagnosis Date   Hypertension     History reviewed. No pertinent surgical history.   reports that he has never smoked. He has never used smokeless tobacco. He reports that he does not currently use alcohol. He reports that he does not use drugs.  No Known Allergies  History reviewed. No pertinent family history.  Prior to Admission medications   Medication Sig Start Date End Date Taking? Authorizing Provider  amLODipine  (NORVASC ) 2.5 MG tablet Take 2.5 mg by mouth daily.  07/26/23 07/25/24 Yes [provider]  losartan  (COZAAR ) 100 MG tablet Take 100 mg by mouth daily. 07/26/23 07/25/24 Yes [provider]  triamcinolone cream (KENALOG) 0.1 % Apply 1 Application topically 2 (two) times daily as needed. 12/27/22 12/27/23 Yes [provider]  losartan -hydrochlorothiazide  (HYZAAR) 50-12.5 MG tablet Take 1 tablet by mouth daily. Patient not taking: Reported on 08/27/2023 08/26/21   Emilio Kelly DASEN, FNP    Physical Exam: Vitals:   08/26/23 2223 08/26/23 2300 08/26/23 2330 08/27/23 0243  BP:  (!) 142/97 (!) 133/91 (!) 159/96  Pulse:  (!) 101 96 (!) 101  Resp:  18 (!) 22 16  Temp:    98.3 F (36.8 C)  TempSrc:    Oral  SpO2:  (!) 87% 96% 100%  Weight: 122.5 kg     Height: 6' (1.829 m)       Constitutional: NAD, calm, comfortable Vitals:   08/26/23 2223 08/26/23 2300 08/26/23 2330 08/27/23 0243  BP:  (!) 142/97 (!) 133/91 (!) 159/96  Pulse:  (!) 101 96 (!) 101  Resp:  18 (!) 22 16  Temp:    98.3 F (36.8 C)  TempSrc:    Oral  SpO2:  (!) 87% 96% 100%  Weight: 122.5 kg     Height: 6' (1.829 m)      Eyes: lids and conjunctivae normal Neck: normal, supple Respiratory: clear to auscultation bilaterally. Normal respiratory effort. No accessory muscle use.  Cardiovascular:  Regular rate and rhythm, no murmurs. Abdomen: no tenderness, no distention. Bowel sounds positive.  Musculoskeletal:  No edema. Skin: no rashes, lesions, ulcers.  Psychiatric: Flat affect  Labs on Admission: I have personally reviewed following labs and imaging studies  CBC: Recent Labs  Lab 08/26/23 2228 08/27/23 0426  WBC 7.6 12.2*  HGB 14.3 14.0  HCT 41.4 41.9  MCV 88.5 89.0  PLT 246 237   Basic Metabolic Panel: Recent Labs  Lab 08/26/23 2228  NA 137  K 3.6  CL 106  CO2 23  GLUCOSE 131*  BUN 13  CREATININE 0.87  CALCIUM 9.0   GFR: Estimated Creatinine Clearance: 164.8 mL/min (by C-G formula based on SCr of 0.87 mg/dL). Liver Function  Tests: Recent Labs  Lab 08/26/23 2228  AST 27  ALT 34  ALKPHOS 59  BILITOT 0.5  PROT 7.1  ALBUMIN 4.0   No results for input(s): LIPASE, AMYLASE in the last 168 hours. No results for input(s): AMMONIA in the last 168 hours. Coagulation Profile: Recent Labs  Lab 08/26/23 2228 08/27/23 0426  INR 0.9 1.0   Cardiac Enzymes: No results for input(s): CKTOTAL, CKMB, CKMBINDEX, TROPONINI in the last 168 hours. BNP (last 3 results) No results for input(s): PROBNP in the last 8760 hours. HbA1C: No results for input(s): HGBA1C in the last 72 hours. CBG: No results for input(s): GLUCAP in the last 168 hours. Lipid Profile: No results for input(s): CHOL, HDL, LDLCALC, TRIG, CHOLHDL, LDLDIRECT in the last 72 hours. Thyroid Function Tests: No results for input(s): TSH, T4TOTAL, FREET4, T3FREE, THYROIDAB in the last 72 hours. Anemia Panel: No results for input(s): VITAMINB12, FOLATE, FERRITIN, TIBC, IRON, RETICCTPCT in the last 72 hours. Urine analysis:    Component Value Date/Time   BILIRUBINUR negative 05/19/2020 1045   PROTEINUR Negative 05/19/2020 1045   UROBILINOGEN 0.2 05/19/2020 1045   NITRITE negative 05/19/2020 1045   LEUKOCYTESUR Negative 05/19/2020 1045    Radiological Exams on Admission: No results found.  Assessment/Plan Principal Problem:   Poisonous snake bite, accidental or unintentional, initial encounter Active Problems:   Primary hypertension    Snakebite - Patient has received CroFab  and is receiving close monitoring per poison control recommendations with repeat check on extremity circumferences which appear to be showing signs of decreased swelling. 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 Observation status is judged to be reasonable and necessary in order to provide the required intensity of service to ensure the patient's safety. The patient's presenting symptoms, physical exam findings, and initial radiographic and laboratory data in the context of their medical condition is felt to place them at decreased risk for further clinical deterioration. Furthermore, it is anticipated that the patient will be medically stable for discharge from the hospital within 2 midnights of admission.    Maksim Peregoy D Maree DO [facility]  If 7PM-7AM, please contact night-coverage [email or web address]  08/27/2023, 7:17 AM

## 2023-08-27 NOTE — ED Notes (Addendum)
 Circumference measurements Toes: 22 cm Ankle: 25.8 cm Calf: 44 cm

## 2023-08-27 NOTE — ED Notes (Signed)
 Circumference measurements  Toes: 24 cm Ankle: 25.9 cm Calf: 44 cm

## 2023-08-27 NOTE — Plan of Care (Signed)

## 2023-08-27 NOTE — Progress Notes (Signed)
 Spoke to [place] from poison control this AM. Pt has no c/o. No further swelling noted. VSS. Labs done. Will continue to monitor.

## 2023-08-27 NOTE — Progress Notes (Signed)
 Toes  21 cm Ankle 23.5 cm Calf    36 cm

## 2023-08-27 NOTE — TOC CM/SW Note (Signed)
 Transition of Care [facility]) - Inpatient Brief Assessment   Patient Details  Name: Jeffrey Zamora MRN: [id number] Date of Birth: 15-Feb-1991  [facility] CM/SW Contact:    Wednesday Ericsson E Salam Micucci, LCSW Phone Number: 08/27/2023, 12:11 PM   Clinical Narrative: No needs identified during chart review. Please consult TOC if needs arise prior to discharge.   Transition of Care Asessment: Insurance and Status: Insurance coverage has been reviewed Patient has primary care physician: Yes     Prior/Current Home Services: No current home services Social Drivers of Health Review: SDOH reviewed no interventions necessary Readmission risk has been reviewed: Yes Transition of care needs: no transition of care needs at this time

## 2023-08-27 NOTE — ED Notes (Signed)
 Spoke with Franky from [employer]. Recommended to elevate pt's foot, trend circumference of 3 sites (at the foot and 2 spots above) each hour while receiving Crofab , if trending measurements go up pt should receive more Crofab , recheck labs 6 hours post-bite (PT/INR, platelets, and fibrinogen ), monitor pt for at least 6 hours after administration of Crofab , and to hold off on administration of Morphine  for pain control d/t possibility of histamine release.

## 2023-08-27 NOTE — Progress Notes (Signed)
 Toes 22cm Ankle 24cm Calf 37cm

## 2023-08-27 NOTE — Progress Notes (Signed)
 Toes 23cm Ankle 25cm Calf 38cm

## 2023-09-02 ENCOUNTER — Encounter: Payer: Self-pay | Admitting: Family Medicine

## 2023-09-02 ENCOUNTER — Ambulatory Visit (INDEPENDENT_AMBULATORY_CARE_PROVIDER_SITE_OTHER): Admitting: Family Medicine

## 2023-09-02 VITALS — BP 135/83 | HR 91 | Resp 16 | Ht 72.0 in | Wt 269.1 lb

## 2023-09-02 DIAGNOSIS — W5911XD Bitten by nonvenomous snake, subsequent encounter: Secondary | ICD-10-CM

## 2023-09-02 DIAGNOSIS — T63001D Toxic effect of unspecified snake venom, accidental (unintentional), subsequent encounter: Secondary | ICD-10-CM

## 2023-09-02 DIAGNOSIS — I10 Essential (primary) hypertension: Secondary | ICD-10-CM

## 2023-09-02 DIAGNOSIS — Z09 Encounter for follow-up examination after completed treatment for conditions other than malignant neoplasm: Secondary | ICD-10-CM

## 2023-09-02 MED ORDER — HYDROCODONE-ACETAMINOPHEN 5-325 MG PO TABS: 1.0000 | ORAL_TABLET | ORAL | 0 refills | Status: AC | PRN

## 2023-09-02 MED ORDER — LOSARTAN POTASSIUM 100 MG PO TABS: 100.0000 mg | ORAL_TABLET | Freq: Every day | ORAL | 1 refills | Status: AC

## 2023-09-02 NOTE — Progress Notes (Signed)
 Established patient visit   Patient: Jeffrey Zamora   DOB: 1990/07/19   32 y.o. Male  MRN: [id number] Visit Date: 09/02/2023  Today's healthcare provider: LAURAINE LOISE BUOY, DO   Chief Complaint  Patient presents with   Follow-up    Snake bite   Hypertension   Medication Refill    Wants refill on Hydrocodone -Acetaminophen , has pressure in legs in the mornings.   Subjective    Hypertension  Medication Refill   Jeffrey Zamora is a 33 year old male who presents with a snake bite on his leg.  He was bitten by a snake, believed to be a copperhead, on his leg last Friday. Since the bite, he has experienced swelling and warmth in the affected area. He has difficulty bending his leg compared to the other one and describes altered sensations in his toes, with one feeling like it's touching plastic and another feeling like steel.  He was contacted by poison control a couple of times since the incident. He was discharged from the hospital the day after the bite and has been out of work since then. He is concerned about returning to work, which involves being on his feet a lot, and whether he can wear steel toe shoes.  He is currently taking amlodipine  for blood pressure and has been prescribed losartan  hydrochlorothiazide , as he ran out of his previous losartan  medication. He is also taking Norco for pain management related to the snake bite.  He is planning a trip to [facility] with his girlfriend and is concerned about the long drive due to his leg condition.  FMLA  6/23-7/6       Medications: Outpatient Medications Prior to Visit  Medication Sig Note   amLODipine  (NORVASC ) 2.5 MG tablet Take 1 tablet (2.5 mg total) by mouth daily.    triamcinolone cream (KENALOG) 0.1 % Apply 1 Application topically 2 (two) times daily as needed.    [DISCONTINUED] HYDROcodone -acetaminophen  (NORCO/VICODIN) 5-325 MG tablet Take 1-2 tablets by mouth every 4 (four) hours as needed for  moderate pain (pain score 4-6) or severe pain (pain score 7-10).    [DISCONTINUED] losartan -hydrochlorothiazide  (HYZAAR) 50-12.5 MG tablet Take 1 tablet by mouth daily. (Patient not taking: Reported on 09/02/2023) 09/02/2023: return to prior dosing/therapy   No facility-administered medications prior to visit.        Objective    BP 135/83 (BP Location: Left Arm, Patient Position: Sitting, Cuff Size: Large)   Pulse 91   Resp 16   Ht 6' (1.829 m)   Wt 269 lb 1.6 oz (122.1 kg)   SpO2 99%   BMI 36.50 kg/m     Physical Exam Vitals and nursing note reviewed.  Constitutional:      General: He is not in acute distress.    Appearance: Normal appearance.  HENT:     Head: Normocephalic and atraumatic.   Eyes:     General: No scleral icterus.    Conjunctiva/sclera: Conjunctivae normal.    Cardiovascular:     Rate and Rhythm: Normal rate.  Pulmonary:     Effort: Pulmonary effort is normal.   Musculoskeletal:       Feet:  Feet:     Right foot:     Skin integrity: Erythema present.     Toenail Condition: Right toenails are normal.     Comments: Swelling of right 1st digit; rust-colored blood under skin superficially in areas noted in red; bruising proximal to nail as noted.  Full digit remains mildly erythematous.  Neurological:     Mental Status: He is alert and oriented to person, place, and time. Mental status is at baseline.   Psychiatric:        Mood and Affect: Mood normal.        Behavior: Behavior normal.      No results found for any visits on 09/02/23.  Assessment & Plan    Hospital discharge follow-up  Snake bite, subsequent encounter -     HYDROcodone -Acetaminophen ; Take 1-2 tablets by mouth every 4 (four) hours as needed for moderate pain (pain score 4-6) or severe pain (pain score 7-10).  Dispense: 12 tablet; Refill: 0  Primary hypertension -     Losartan  Potassium; Take 1 tablet (100 mg total) by mouth daily.  Dispense: 90 tablet; Refill: 1    Snake  bite Likely copperhead bite causing leg swelling, discomfort, and rust-colored discoloration. Advised on thrombosis risk with prolonged leg dependency. - Advise leg elevation. - Recommend postponing travel or frequent stops with ambulation if travel is necessary. - Prescribe analgesics for discomfort. - Advise against driving on analgesics. - Continue follow-up with poison control.  Hypertension Managed with losartan  and amlodipine . Confusion about medication refills and combination pill. Confirmed adherence to amlodipine . - Prescribe losartan  100 mg. - Ensure not taking both losartan  and losartan -hydrochlorothiazide  (pt did not pick up prescription of the latter). - Confirm adherence to amlodipine .  Follow-up Requires follow-up for snake bite recovery and work clearance. Annual check-up due in October. - Schedule follow-up on or before July 7 if needed. - Plan annual check-up in October.    Return in about 3 months (around 12/13/2023), or if symptoms worsen or fail to improve, for CPE.      I discussed the assessment and treatment plan with the patient  The patient was provided an opportunity to ask questions and all were answered. The patient agreed with the plan and demonstrated an understanding of the instructions.   The patient was advised to call back or seek an in-person evaluation if the symptoms worsen or if the condition fails to improve as anticipated.    LAURAINE LOISE BUOY, DO  [facility] [phone] (phone) [phone] (fax)  [facility]

## 2023-09-12 ENCOUNTER — Inpatient Hospital Stay: Admitting: Family Medicine

## 2023-11-09 ENCOUNTER — Ambulatory Visit: Admitting: Physician Assistant

## 2023-11-10 ENCOUNTER — Ambulatory Visit: Payer: Self-pay | Admitting: *Deleted

## 2023-11-10 NOTE — Telephone Encounter (Signed)
 FYI Only or Action Required?: FYI only for provider.  Patient was last seen in primary care on 09/02/2023 by Donzella Lauraine SAILOR, DO.  Called Nurse Triage reporting Joint Swelling.  Symptoms began several days ago.  Interventions attempted: OTC medications: tylenol  ibuprofen and wearing brace .  Symptoms are: gradually worsening.  Triage Disposition: See Physician Within 24 Hours  Patient/caregiver understands and will follow disposition?: Yes            Copied from CRM #[id number]. Topic: Clinical - Red Word Triage >> Nov 10, 2023  4:15 PM Avram MATSU wrote: Red Word that prompted transfer to Nurse Triage: patient had a recent sprain, some swelling and pain.   ----------------------------------------------------------------------- From previous Reason for Contact - Scheduling: Patient/patient representative is calling to schedule an appointment. Refer to attachments for appointment information. Reason for Disposition  SEVERE swelling (e.g., can barely bend or move wrist joint)  Answer Assessment - Initial Assessment Questions Appt scheduled tomorrow with other provider. None available with PCP. Patient has missed work due to injury. Recommended if pain swelling worsening go to [facility] or [facility].     1. ONSET: When did the swelling start? (e.g., minutes, hours, days, weeks)     Saturday  2. LOCATION: What part of the wrist is swollen?  Are both wrists swollen or just one wrist?     Pinky side to middle finger  3. SEVERITY: How bad is the swelling?      1 times more swollen than left  4. RECURRENT SYMPTOM: Have you had wrist swelling before? If Yes, ask: When was the last time? What happened that time?     no 5. CAUSE: What do you think is causing the wrist swelling? (e.g., arthritis, ganglion cyst, insect bite, recent injury)     Recent fall 6. OTHER SYMPTOMS: Do you have any other symptoms? (e.g., fever, hand pain)     Right wrist pain swelling ,  throbbing pain with movement to left and right and back. 7. PREGNANCY: Is there any chance you are pregnant? When was your last menstrual period?     na  Protocols used: Wrist Swelling-A-AH

## 2023-11-11 ENCOUNTER — Ambulatory Visit (INDEPENDENT_AMBULATORY_CARE_PROVIDER_SITE_OTHER): Admitting: Physician Assistant

## 2023-11-11 ENCOUNTER — Ambulatory Visit
Admission: RE | Admit: 2023-11-11 | Discharge: 2023-11-11 | Disposition: A | Discharge status: Home / Self Care | Attending: Physician Assistant | Admitting: Physician Assistant

## 2023-11-11 ENCOUNTER — Encounter: Payer: Self-pay | Admitting: Physician Assistant

## 2023-11-11 ENCOUNTER — Ambulatory Visit
Admission: RE | Admit: 2023-11-11 | Discharge: 2023-11-11 | Disposition: A | Discharge status: Home / Self Care | Source: Ambulatory Visit | Attending: Physician Assistant | Admitting: Physician Assistant

## 2023-11-11 VITALS — BP 127/74 | HR 91 | Ht 72.0 in | Wt 270.5 lb

## 2023-11-11 DIAGNOSIS — M25539 Pain in unspecified wrist: Secondary | ICD-10-CM | POA: Diagnosis not present

## 2023-11-11 DIAGNOSIS — W19XXXA Unspecified fall, initial encounter: Secondary | ICD-10-CM | POA: Diagnosis not present

## 2023-11-11 DIAGNOSIS — M79631 Pain in right forearm: Secondary | ICD-10-CM | POA: Insufficient documentation

## 2023-11-11 DIAGNOSIS — M25531 Pain in right wrist: Secondary | ICD-10-CM | POA: Insufficient documentation

## 2023-11-11 DIAGNOSIS — I10 Essential (primary) hypertension: Secondary | ICD-10-CM | POA: Diagnosis not present

## 2023-11-11 NOTE — Progress Notes (Signed)
 Established patient visit  Patient: Jeffrey Zamora   DOB: May 31, 1990   33 y.o. Male  MRN: [id number] Visit Date: 11/11/2023  Today's healthcare provider: Jolynn Spencer, PA-C   Chief Complaint  Patient presents with   Acute Visit    Patient is present due to right wrist swelling and pain from fall and requesting work note   Fall    Patient reports falling on Saturday. He reports the pinky side to his middle finger is swollen. He reports a throbbing sensation with movement to left right and back. He reports swelling is going down some since calling in. Patient reports he is taking tylenol  for pain with minor relief   Subjective     HPI     Acute Visit    Additional comments: Patient is present due to right wrist swelling and pain from fall and requesting work note        Fall    Additional comments: Patient reports falling on Saturday. He reports the pinky side to his middle finger is swollen. He reports a throbbing sensation with movement to left right and back. He reports swelling is going down some since calling in. Patient reports he is taking tylenol  for pain with minor relief      Last edited by Lilian Fitzpatrick, CMA on 11/11/2023  1:45 PM.       Discussed the use of AI scribe software for clinical note transcription with the patient, who gave verbal consent to proceed.  History of Present Illness Jeffrey Zamora is a 33 year old male who presents with right hand pain and swelling following a fall.  He fell while doing yard work, stepping backwards and catching himself with his right hand, which resulted in sharp pain. Swelling developed approximately thirty minutes later. He applied ice and used a brace. Over the past week, he has been unable to fully close his hand, though there is some improvement as he can now close it completely with persistent pain. Pain is managed with ibuprofen and Tylenol . He experiences a stinging sensation and swelling primarily between the  fourth and fifth fingers. He takes medication for hypertension daily without issues and anticipates needing a refill soon.       11/11/2023    1:49 PM 09/02/2023    8:41 AM 08/26/2021    3:14 PM  Depression screen PHQ 2/9  Decreased Interest 1 0 0  Down, Depressed, Hopeless 0 0 0  PHQ - 2 Score 1 0 0  Altered sleeping 0 2 0  Tired, decreased energy 1 2 1   Change in appetite 1 0 1  Feeling bad or failure about yourself  0 0 0  Trouble concentrating 0 0 0  Moving slowly or fidgety/restless 0 1 0  Suicidal thoughts 0 0 0  PHQ-9 Score 3 5 2   Difficult doing work/chores Not difficult at all Not difficult at all Not difficult at all      11/11/2023    1:49 PM 09/02/2023    8:42 AM  GAD 7 : Generalized Anxiety Score  Nervous, Anxious, on Edge 2 0  Control/stop worrying 1 0  Worry too much - different things 1 1  Trouble relaxing 0 0  Restless 0 0  Easily annoyed or irritable 2 0  Afraid - awful might happen 2 0  Total GAD 7 Score 8 1  Anxiety Difficulty Somewhat difficult Not difficult at all    Medications: Outpatient Medications Prior to Visit  Medication Sig  amLODipine  (NORVASC ) 2.5 MG tablet Take 1 tablet (2.5 mg total) by mouth daily.   HYDROcodone -acetaminophen  (NORCO/VICODIN) 5-325 MG tablet Take 1-2 tablets by mouth every 4 (four) hours as needed for moderate pain (pain score 4-6) or severe pain (pain score 7-10).   losartan  (COZAAR ) 100 MG tablet Take 1 tablet (100 mg total) by mouth daily.   triamcinolone cream (KENALOG) 0.1 % Apply 1 Application topically 2 (two) times daily as needed.   No facility-administered medications prior to visit.    Review of Systems All negative Except see HPI       Objective    BP 127/74 (BP Location: Left Arm, Patient Position: Sitting, Cuff Size: Large)   Pulse 91   Ht 6' (1.829 m)   Wt 270 lb 8 oz (122.7 kg)   SpO2 98%   BMI 36.69 kg/m     Physical Exam Vitals reviewed.  Constitutional:      General: He is not in  acute distress.    Appearance: Normal appearance. He is not diaphoretic.  HENT:     Head: Normocephalic and atraumatic.  Eyes:     General: No scleral icterus.    Conjunctiva/sclera: Conjunctivae normal.  Cardiovascular:     Rate and Rhythm: Normal rate and regular rhythm.     Pulses: Normal pulses.     Heart sounds: Normal heart sounds. No murmur heard. Pulmonary:     Effort: Pulmonary effort is normal. No respiratory distress.     Breath sounds: Normal breath sounds. No wheezing or rhonchi.  Musculoskeletal:     Cervical back: Neck supple.     Right lower leg: No edema.     Left lower leg: No edema.  Lymphadenopathy:     Cervical: No cervical adenopathy.  Skin:    General: Skin is warm and dry.     Findings: No rash.  Neurological:     Mental Status: He is alert and oriented to person, place, and time. Mental status is at baseline.  Psychiatric:        Mood and Affect: Mood normal.        Behavior: Behavior normal.      No results found for any visits on 11/11/23.      Assessment & Plan Right wrist pain after fall Acute right wrist pain and swelling post-fall, possible soft tissue injury or fracture. - Order X-ray of right hand, wrist, and elbow. - Advise ice and elevation to reduce swelling. - Recommend Tylenol  or ibuprofen for pain. - Offer Celebrex prescription, take with meals. - Refer to orthopedics if X-ray indicates further management needed: immobilization with a splint or brace to reduce movement and allow healing, NSAIDs to reduce pain and inflammation, and possibly corticosteroid injections, application of ice to manage swelling, elevation of the writs to decrease swelling  Essential hypertension Chronic and stable Hypertension well-controlled with current medication. - Continue current antihypertensive regimen: Losartan  100 mg and amlodipine  2.5 mg - Advise to request medication refill as needed. Continue low-sodium diet and regular exercise Will  follow-up  Pain in wrist, unspecified laterality (Primary)  - DG Hand Complete Right - DG Wrist Complete Right - DG Elbow Complete Right   Fall, initial encounter  - DG Hand Complete Right - DG Wrist Complete Right - DG Elbow Complete Right  No orders of the defined types were placed in this encounter.   No follow-ups on file.   The patient was advised to call back or seek an in-person evaluation if the  symptoms worsen or if the condition fails to improve as anticipated.  I discussed the assessment and treatment plan with the patient. The patient was provided an opportunity to ask questions and all were answered. The patient agreed with the plan and demonstrated an understanding of the instructions.  I, Naiyana Barbian, PA-C have reviewed all documentation for this visit. The documentation on 11/11/2023  for the exam, diagnosis, procedures, and orders are all accurate and complete.  Jolynn Spencer, [facility], MMS [facility] [phone] (phone) [phone] (fax)  [facility]

## 2023-11-13 DIAGNOSIS — M25539 Pain in unspecified wrist: Secondary | ICD-10-CM | POA: Insufficient documentation

## 2023-11-13 DIAGNOSIS — W19XXXA Unspecified fall, initial encounter: Secondary | ICD-10-CM | POA: Insufficient documentation

## 2023-11-14 ENCOUNTER — Ambulatory Visit: Payer: Self-pay | Admitting: Physician Assistant

## 2023-12-16 ENCOUNTER — Encounter: Admitting: Family Medicine
